# Patient Record
Sex: Male | Born: 1980 | Race: Black or African American | Hispanic: No | Marital: Married | Smoking: Never smoker
Health system: Southern US, Community
[De-identification: ages and names within clinical notes are randomized; demographics above are authoritative.]

## PROBLEM LIST (undated history)

## (undated) DIAGNOSIS — S82892A Other fracture of left lower leg, initial encounter for closed fracture: Secondary | ICD-10-CM

## (undated) HISTORY — PX: TONSILLECTOMY: SUR1361

## (undated) HISTORY — DX: Other fracture of left lower leg, initial encounter for closed fracture: S82.892A

## (undated) HISTORY — PX: TONSILLECTOMY AND ADENOIDECTOMY: SHX28

---

## 2018-02-27 ENCOUNTER — Encounter: Payer: Self-pay | Admitting: Nurse Practitioner

## 2018-02-27 ENCOUNTER — Ambulatory Visit: Payer: Self-pay | Admitting: Nurse Practitioner

## 2018-02-27 VITALS — BP 140/90 | HR 93 | Temp 102.1°F | Wt 286.2 lb

## 2018-02-27 DIAGNOSIS — R509 Fever, unspecified: Secondary | ICD-10-CM

## 2018-02-27 LAB — POC URINALSYSI DIPSTICK (AUTOMATED)
Bilirubin, UA: NEGATIVE
GLUCOSE UA: NEGATIVE
KETONES UA: NEGATIVE
Leukocytes, UA: NEGATIVE
Nitrite, UA: NEGATIVE
Protein, UA: NEGATIVE
RBC UA: NEGATIVE
Urobilinogen, UA: 0.2 E.U./dL
pH, UA: 8 (ref 5.0–8.0)

## 2018-02-27 MED ORDER — LEVOFLOXACIN 500 MG PO TABS: 500.0000 mg | ORAL_TABLET | Freq: Every day | ORAL | 0 refills | Status: AC

## 2018-02-27 NOTE — Progress Notes (Signed)
Subjective:     Justin Serrano is a 37 y.o. male who presents for evaluation of fever. He has had the fever for 3 days. Symptoms have been gradually worsening. Symptoms are described as fevers up to 101.9 degrees, and are worse anytime of the day. Associated symptoms are body aches, chills, diarrhea, fatigue, headache, poor appetite and URI symptoms.  Patient denies nausea, otitis symptoms, urinary tract symptoms and vomiting.  He has tried to alleviate the symptoms with nothing at this time. The patient has no known comorbidities (structural heart/valvular disease, prosthetic joints, immunocompromised state, recent dental work, known abscesses).  The following portions of the patient's history were reviewed and updated as appropriate: allergies, current medications and past medical history.  Review of Systems Constitutional: positive for anorexia, chills, fatigue and fevers, negative for night sweats, sweats and weight loss Eyes: negative Ears, nose, mouth, throat, and face: negative Respiratory: negative Cardiovascular: negative Gastrointestinal: positive for diarrhea, negative for abdominal pain, change in bowel habits, constipation, nausea and vomiting Neurological: positive for headaches, negative for paresthesia, seizures, speech problems, tremors, vertigo and weakness   Objective:    BP 140/90   Pulse 93   Temp (!) 102.1 F (38.9 C)   Wt 286 lb 3.2 oz (129.8 kg)   SpO2 98%  General appearance: alert, cooperative, fatigued and no distress Head: Normocephalic, without obvious abnormality, atraumatic Eyes: conjunctivae/corneas clear. PERRL, EOM's intact. Fundi benign. Ears: normal TM's and external ear canals both ears Nose: Nares normal. Septum midline. Mucosa normal. No drainage or sinus tenderness. Throat: lips, mucosa, and tongue normal; teeth and gums normal Lungs: clear to auscultation bilaterally Heart: regular rate and rhythm, S1, S2 normal, no murmur, click, rub or  gallop Abdomen: soft, non-tender; bowel sounds normal; no masses,  no organomegaly Pulses: 2+ and symmetric Skin: Skin color, texture, turgor normal. No rashes or lesions Lymph nodes: cervical and submandibular nodes normal Neurologic: Grossly normal   Urinalysis was done in the office. Glucose- negative; bilirubin- negative; ketones- negative; specific gravity- 1.005; blood- negative; pH- 8.0, protein- negative; uro-0.2; nitrates- negative; and leukocytes- negative.  Assessment:    Fever is likely secondary to Unknown Origin.   Plan:   Discussed with Dr. Lovell SheehanJenkins the patient's exam findings, diagnosis etiology and medication use and indications reviewed with patient.  Based on that discussion, the patient was instructed to start his Levaquin 500 mg once a day for 7 days.  She was also instructed to start ibuprofen 800 mg every 8 hours until he has been fever free for at least 24 hours.  Patient was instructed to take this medication with food and water on his stomach to protect the stomach lining.  This patient was instructed to follow-up with me tomorrow via phone call.  Patient was instructed to go to the ER immediately if he has increasing fever that will not be relieved with ibuprofen or abdominal pain.  Some concern for an abdominal infection, or bronchial pneumonia that is undetectable on physical exam.  The patient was instructed if he has any abdominal tenderness or worsening symptoms that he should follow-up in the emergency room immediately this was reiterated to the patient several times during our discussion.  Follow- Up and discharge instructions provided. No emergent/urgent issues found on exam.  Patient verbalized understanding of information provided and agrees with plan of care (POC), all questions answered.  1. Fever, unspecified  - POCT Urinalysis Dipstick (Automated) - levofloxacin (LEVAQUIN) 500 MG tablet; Take 1 tablet (500 mg total) by mouth  daily for 7 days.  Dispense: 7  tablet; Refill: 0 -Ibuprofen 800mg  every 8 hours until fever free for 24 hours. -Increase fluids. -Take Levaquin as directed.  -Call our office tomorrow to provide an update for your condition. -Follow up in the ER if worsening fever, abdominal pain, abdominal tenderness or other concerns.

## 2018-02-27 NOTE — Patient Instructions (Signed)
Fever, Adult  -Ibuprofen 800mg  every 8 hours until fever free for 24 hours. -Increase fluids. -Take Levaquin as directed.  -Call our office tomorrow to provide an update for your condition. -Follow up in the ER if worsening fever, abdominal pain, abdominal tenderness or other concerns.  A fever is an increase in the body's temperature. It is usually defined as a temperature of 100F (38C) or higher. Brief mild or moderate fevers generally have no long-term effects, and they often do not require treatment. Moderate or high fevers may make you feel uncomfortable and can sometimes be a sign of a serious illness or disease. The sweating that may occur with repeated or prolonged fever may also cause dehydration. Fever is confirmed by taking a temperature with a thermometer. A measured temperature can vary with:  Age.  Time of day.  Location of the thermometer: ? Mouth (oral). ? Rectum (rectal). ? Ear (tympanic). ? Underarm (axillary). ? Forehead (temporal).  Follow these instructions at home: Pay attention to any changes in your symptoms. Take these actions to help with your condition:  Take over-the counter and prescription medicines only as told by your health care provider. Follow the dosing instructions carefully.  If you were prescribed an antibiotic medicine, take it as told by your health care provider. Do not stop taking the antibiotic even if you start to feel better.  Rest as needed.  Drink enough fluid to keep your urine clear or pale yellow. This helps to prevent dehydration.  Sponge yourself or bathe with room-temperature water to help reduce your body temperature as needed. Do not use ice water.  Do not overbundle yourself in blankets or heavy clothes.  Contact a health care provider if:  You vomit.  You cannot eat or drink without vomiting.  You have diarrhea.  You have pain when you urinate.  Your symptoms do not improve with treatment.  You develop new  symptoms.  You develop excessive weakness. Get help right away if:  You have shortness of breath or have trouble breathing.  You are dizzy or you faint.  You are disoriented or confused.  You develop signs of dehydration, such as a dry mouth, decreased urination, or paleness.  You develop severe pain in your abdomen.  You have persistent vomiting or diarrhea.  You develop a skin rash.  Your symptoms suddenly get worse. This information is not intended to replace advice given to you by your health care provider. Make sure you discuss any questions you have with your health care provider. Document Released: 01/10/2001 Document Revised: 12/23/2015 Document Reviewed: 09/10/2014 Elsevier Interactive Patient Education  [employer]2018 Elsevier Inc.

## 2018-03-01 ENCOUNTER — Telehealth: Payer: Self-pay

## 2018-03-01 NOTE — Telephone Encounter (Signed)
Patient states he is doing great.

## 2018-03-12 ENCOUNTER — Ambulatory Visit: Payer: BC Managed Care – PPO | Admitting: Urgent Care

## 2018-03-12 ENCOUNTER — Encounter: Payer: Self-pay | Admitting: Urgent Care

## 2018-03-12 VITALS — BP 134/84 | HR 90 | Temp 98.3°F | Ht 73.0 in | Wt 286.2 lb

## 2018-03-12 DIAGNOSIS — R590 Localized enlarged lymph nodes: Secondary | ICD-10-CM | POA: Diagnosis not present

## 2018-03-12 NOTE — Progress Notes (Signed)
MRN: [id number] DOB: 06-28-1981  Subjective:   Justin Serrano is a 37 y.o. male presenting for several week history of post-nasal drainage, throat discomfort. Was seen at an [facility], received antibiotics and resolved this issue. He subsequently got a mosquito bite over posterior base of his head. Still has a bump over the area. Denies fever, pain, drainage of pus or bleeding over area, chest pain, abdominal pain, n/v, headaches, dizziness, throat pain, rashes. Denies smoking cigarettes. Has ~4 drinks of alcohol per week. Of note, patient had a CT done for abdominal pain, was having diarrhea that then swung to constipation from using Imodium. CT showed lymphadenopathy per patient and was advised to follow up with a primary care office.   Justin Serrano is not currently taking any medications. Also is allergic to ampicillin.  Justin Serrano  has a past medical history of Closed left ankle fracture. Also  has a past surgical history that includes Tonsillectomy. Maternal grandfather passed from prostate cancer.   Objective:   Vitals: BP 134/84 (BP Location: Right Arm, Patient Position: Sitting, Cuff Size: Large)   Pulse 90   Temp 98.3 F (36.8 C) (Oral)   Ht 6\' 1"  (1.854 m)   Wt 286 lb 3.2 oz (129.8 kg)   SpO2 99%   BMI 37.76 kg/m   Physical Exam  Constitutional: He is oriented to person, place, and time. He appears well-developed and well-nourished.  HENT:  Right Ear: Tympanic membrane normal.  Left Ear: Tympanic membrane normal.  Nose: No mucosal edema, rhinorrhea or sinus tenderness.  Mouth/Throat: Oropharynx is clear and moist.  Eyes: Pupils are equal, round, and reactive to light. EOM are normal. Right eye exhibits no discharge. Left eye exhibits no discharge. No scleral icterus.  Neck: Normal range of motion. Neck supple. No thyromegaly present.  Cardiovascular: Normal rate, regular rhythm, normal heart sounds and intact distal pulses. Exam reveals no gallop and no friction rub.  No murmur  heard. Pulmonary/Chest: Effort normal and breath sounds normal. No stridor. No respiratory distress. He has no wheezes. He has no rales.  Abdominal: Soft. Bowel sounds are normal. He exhibits no distension and no mass. There is no tenderness. There is no rebound and no guarding.  Lymphadenopathy:       Head (right side): Occipital adenopathy present. No submental, no submandibular, no preauricular and no posterior auricular adenopathy present.       Head (left side): No submental, no submandibular, no preauricular, no posterior auricular and no occipital adenopathy present.    He has no cervical adenopathy.       Right cervical: No superficial cervical, no deep cervical and no posterior cervical adenopathy present.      Left cervical: No superficial cervical, no deep cervical and no posterior cervical adenopathy present.    He has no axillary adenopathy.       Right: No inguinal and no supraclavicular adenopathy present.       Left: No inguinal and no supraclavicular adenopathy present.  Neurological: He is alert and oriented to person, place, and time.  Skin: Skin is warm and dry.  Psychiatric: He has a normal mood and affect.   Assessment and Plan :   Lymphadenopathy, occipital - Plan: CBC with Differential/Platelet, HIV antibody, Epstein-Barr virus VCA antibody panel, RPR, Comprehensive metabolic panel  Lymphadenopathy, abdominal - Plan: CBC with Differential/Platelet, HIV antibody, Epstein-Barr virus VCA antibody panel, RPR, Comprehensive metabolic panel, TSH  Labs pending, will have patient start Tylenol and ibuprofen for his lymphadenopathy.  Ultrasound  will be pending for further evaluation and visualization of his occipital lymphadenopathy.  Wallis BambergMario Junah Yam, PA-C [facility] at [facility] [phone] 03/12/2018  9:27 AM

## 2018-03-12 NOTE — Patient Instructions (Addendum)
You may take 500mg  Tylenol with ibuprofen 400-600mg  every 6 hours for pain and inflammation from your lymph nodes.    Lymphadenopathy Lymphadenopathy refers to swollen or enlarged lymph glands, also called lymph nodes. Lymph glands are part of your body's defense (immune) system, which protects the body from infections, germs, and diseases. Lymph glands are found in many locations in your body, including the neck, underarm, and groin. Many things can cause lymph glands to become enlarged. When your immune system responds to germs, such as viruses or bacteria, infection-fighting cells and fluid build up. This causes the glands to grow in size. Usually, this is not something to worry about. The swelling and any soreness often go away without treatment. However, swollen lymph glands can also be caused by a number of diseases. Your health care provider may do various tests to help determine the cause. If the cause of your swollen lymph glands cannot be found, it is important to monitor your condition to make sure the swelling goes away. Follow these instructions at home: Watch your condition for any changes. The following actions may help to lessen any discomfort you are feeling:  Get plenty of rest.  Take medicines only as directed by your health care provider. Your health care provider may recommend over-the-counter medicines for pain.  Apply moist heat compresses to the site of swollen lymph nodes as directed by your health care provider. This can help reduce any pain.  Check your lymph nodes daily for any changes.  Keep all follow-up visits as directed by your health care provider. This is important.  Contact a health care provider if:  Your lymph nodes are still swollen after 2 weeks.  Your swelling increases or spreads to other areas.  Your lymph nodes are hard, seem fixed to the skin, or are growing rapidly.  Your skin over the lymph nodes is red and inflamed.  You have a  fever.  You have chills.  You have fatigue.  You develop a sore throat.  You have abdominal pain.  You have weight loss.  You have night sweats. Get help right away if:  You notice fluid leaking from the area of the enlarged lymph node.  You have severe pain in any area of your body.  You have chest pain.  You have shortness of breath. This information is not intended to replace advice given to you by your health care provider. Make sure you discuss any questions you have with your health care provider. Document Released: 04/25/2008 Document Revised: 12/23/2015 Document Reviewed: 02/19/2014 Elsevier Interactive Patient Education  2018 [employer]      IF you received an x-ray today, you will receive an invoice from [facility]. Please contact [facility] at [phone] with questions or concerns regarding your invoice.   IF you received labwork today, you will receive an invoice from [facility]. Please contact [facility] at [phone] with questions or concerns regarding your invoice.   Our billing staff will not be able to assist you with questions regarding bills from these companies.  You will be contacted with the lab results as soon as they are available. The fastest way to get your results is to activate your My Chart account. Instructions are located on the last page of this paperwork. If you have not heard from us regarding the results in 2 weeks, please contact this office.

## 2018-03-14 ENCOUNTER — Encounter: Payer: Self-pay | Admitting: Urgent Care

## 2018-03-14 LAB — COMPREHENSIVE METABOLIC PANEL
A/G RATIO: 1.6 (ref 1.2–2.2)
ALBUMIN: 4.4 g/dL (ref 3.5–5.5)
ALT: 49 IU/L — ABNORMAL HIGH (ref 0–44)
AST: 43 IU/L — ABNORMAL HIGH (ref 0–40)
Alkaline Phosphatase: 55 IU/L (ref 39–117)
BUN/Creatinine Ratio: 13 (ref 9–20)
BUN: 11 mg/dL (ref 6–20)
Bilirubin Total: 0.4 mg/dL (ref 0.0–1.2)
CALCIUM: 9.6 mg/dL (ref 8.7–10.2)
CO2: 25 mmol/L (ref 20–29)
Chloride: 105 mmol/L (ref 96–106)
Creatinine, Ser: 0.85 mg/dL (ref 0.76–1.27)
GFR calc Af Amer: 130 mL/min/{1.73_m2} (ref >59)
GFR, EST NON AFRICAN AMERICAN: 112 mL/min/{1.73_m2} (ref >59)
GLOBULIN, TOTAL: 2.7 g/dL (ref 1.5–4.5)
Glucose: 94 mg/dL (ref 65–99)
POTASSIUM: 4 mmol/L (ref 3.5–5.2)
Sodium: 145 mmol/L — ABNORMAL HIGH (ref 134–144)
Total Protein: 7.1 g/dL (ref 6.0–8.5)

## 2018-03-14 LAB — EPSTEIN-BARR VIRUS VCA ANTIBODY PANEL
EBV NA IgG: >600 U/mL — ABNORMAL HIGH (ref 0.0–17.9)
EBV VCA IgG: >600 U/mL — ABNORMAL HIGH (ref 0.0–17.9)

## 2018-03-14 LAB — CBC WITH DIFFERENTIAL/PLATELET
Basophils Absolute: 0 10*3/uL (ref 0.0–0.2)
Basos: 1 %
EOS (ABSOLUTE): 0.1 10*3/uL (ref 0.0–0.4)
EOS: 1 %
HEMATOCRIT: 43.4 % (ref 37.5–51.0)
Hemoglobin: 14.3 g/dL (ref 13.0–17.7)
IMMATURE GRANULOCYTES: 0 %
Immature Grans (Abs): 0 10*3/uL (ref 0.0–0.1)
Lymphocytes Absolute: 2.1 10*3/uL (ref 0.7–3.1)
Lymphs: 39 %
MCH: 28.9 pg (ref 26.6–33.0)
MCHC: 32.9 g/dL (ref 31.5–35.7)
MCV: 88 fL (ref 79–97)
MONOS ABS: 0.5 10*3/uL (ref 0.1–0.9)
Monocytes: 9 %
NEUTROS PCT: 50 %
Neutrophils Absolute: 2.7 10*3/uL (ref 1.4–7.0)
PLATELETS: 217 10*3/uL (ref 150–450)
RBC: 4.94 x10E6/uL (ref 4.14–5.80)
RDW: 15.1 % (ref 12.3–15.4)
WBC: 5.3 10*3/uL (ref 3.4–10.8)

## 2018-03-14 LAB — TSH: TSH: 1.17 u[IU]/mL (ref 0.450–4.500)

## 2018-03-14 LAB — RPR: RPR Ser Ql: NONREACTIVE

## 2018-03-14 LAB — HIV ANTIBODY (ROUTINE TESTING W REFLEX): HIV SCREEN 4TH GENERATION: NONREACTIVE

## 2018-03-18 ENCOUNTER — Encounter: Payer: Self-pay | Admitting: Urgent Care

## 2018-03-28 ENCOUNTER — Encounter: Payer: Self-pay | Admitting: Urgent Care

## 2018-04-03 ENCOUNTER — Telehealth: Payer: Self-pay | Admitting: Urgent Care

## 2018-04-03 NOTE — Telephone Encounter (Signed)
Please call patient and schedule follow-up for repeat liver enzymes in 2 months.

## 2018-04-03 NOTE — Telephone Encounter (Signed)
Called pt to try and schedule an appt for a 2 month F/U for liver enzymes per [facility]. Pt said he would have to call back. When he does, please schedule him at his convenience for an OV - 2 month F/U - liver enzymes with [facility].  Thank you!

## 2018-04-03 NOTE — Telephone Encounter (Signed)
Patient's concerns addressed on another my chart encounter.

## 2018-04-05 ENCOUNTER — Ambulatory Visit (HOSPITAL_COMMUNITY)
Admission: RE | Admit: 2018-04-05 | Discharge: 2018-04-05 | Disposition: A | Discharge status: Home / Self Care | Payer: BC Managed Care – PPO | Source: Ambulatory Visit | Attending: Urgent Care | Admitting: Urgent Care

## 2018-04-05 DIAGNOSIS — R590 Localized enlarged lymph nodes: Secondary | ICD-10-CM | POA: Insufficient documentation

## 2018-10-25 ENCOUNTER — Other Ambulatory Visit: Payer: Self-pay

## 2018-10-25 ENCOUNTER — Ambulatory Visit
Admission: EM | Admit: 2018-10-25 | Discharge: 2018-10-25 | Disposition: A | Discharge status: Home / Self Care | Payer: BC Managed Care – PPO | Attending: Physician Assistant | Admitting: Physician Assistant

## 2018-10-25 ENCOUNTER — Encounter: Payer: Self-pay | Admitting: Emergency Medicine

## 2018-10-25 DIAGNOSIS — R0989 Other specified symptoms and signs involving the circulatory and respiratory systems: Secondary | ICD-10-CM

## 2018-10-25 DIAGNOSIS — R198 Other specified symptoms and signs involving the digestive system and abdomen: Secondary | ICD-10-CM

## 2018-10-25 MED ORDER — FLUTICASONE PROPIONATE 50 MCG/ACT NA SUSP: 2.0000 | Freq: Every day | NASAL | 0 refills | Status: DC

## 2018-10-25 MED ORDER — CETIRIZINE HCL 10 MG PO TABS: 10.0000 mg | ORAL_TABLET | Freq: Every day | ORAL | 0 refills | Status: DC

## 2018-10-25 MED ORDER — FAMOTIDINE 20 MG PO TABS: 20.0000 mg | ORAL_TABLET | Freq: Every day | ORAL | 0 refills | Status: DC

## 2018-10-25 MED ORDER — IPRATROPIUM BROMIDE 0.06 % NA SOLN: 2.0000 | Freq: Four times a day (QID) | NASAL | 0 refills | Status: DC

## 2018-10-25 NOTE — Discharge Instructions (Signed)
As discussed, no alarming signs on exam. This can be due to post nasal drip or acid reflux. Start zyrtec, flonase, atrovent nasal spray for nasal congestion/drainage. You can use over the counter nasal saline rinse such as neti pot for nasal congestion. Keep hydrated, your urine should be clear to pale yellow in color. Can start pepcid for acid reflux. Follow up with PCP if symptoms not improving. If experiencing increased chest pain, shortness of breath, weakness, dizziness, palpitations, passing out, go to the emergency department for further evaluation needed.

## 2018-10-25 NOTE — ED Notes (Signed)
Patient able to ambulate independently  

## 2018-10-25 NOTE — ED Triage Notes (Signed)
Pt presents to [facility] for assessment of 1 month of "always feeling like there's phlegm in my throat".  Pt states last night he began having chest heaviness and shortness of breath at the end of climbing the stairs.  Denies cough, denies n/v/d, denies fever or chills.

## 2018-10-25 NOTE — ED Provider Notes (Signed)
[name]    CSN: [id number] Arrival date & time: 10/25/18  1242     History   Chief Complaint Chief Complaint  Patient presents with  . Shortness of Breath    HPI Justin Serrano is a 38 y.o. male.   38 year old male comes in for 1 day history of shortness of breath and chest heaviness.  States for the past month, he has had globus sensation daily. Yesterday, noticed some shortness of breath and chest heaviness when climbing stairs and therefore came in for evaluation. He states the symptoms resolved after a few mins. He denies associated nausea, vomiting, weakness, dizziness, diaphoresis. He states now wonders if it is due to nasal congestion as he had no symptoms when he road his bike for 40 mins and was breathing through his mouth. He has mild cough, mainly to clear his throat. He has not noticed much rhinorrhea. Denies fever, chills, night sweats. Denies abdominal pain, nausea, vomiting. Has not had a repeat symptom of chest heaviness, shortness of breath. He denies personal history of HTN, DM, heart disease. Denies family history of heart disease. Denies sick contact. Has not traveled. Has not tried medications. States increased fluid intake sometimes help with symptoms.      Past Medical History:  Diagnosis Date  . Closed left ankle fracture     There are no active problems to display for this patient.   Past Surgical History:  Procedure Laterality Date  . TONSILLECTOMY         Home Medications    Prior to Admission medications   Medication Sig Start Date End Date Taking? Authorizing Provider  cetirizine (ZYRTEC) 10 MG tablet Take 1 tablet (10 mg total) by mouth daily. 10/25/18   Cathie Hoops, Floree Zuniga V, PA-C  famotidine (PEPCID) 20 MG tablet Take 1 tablet (20 mg total) by mouth daily. 10/25/18   Cathie Hoops, Konstance Happel V, PA-C  fluticasone (FLONASE) 50 MCG/ACT nasal spray Place 2 sprays into both nostrils daily. 10/25/18   Cathie Hoops, Ruthann Angulo V, PA-C  ipratropium (ATROVENT) 0.06 % nasal spray  Place 2 sprays into both nostrils 4 (four) times daily. 10/25/18   Belinda Fisher, PA-C    Family History Family History  Problem Relation Age of Onset  . Cancer Maternal Grandfather     Social History Social History   Tobacco Use  . Smoking status: Never Smoker  . Smokeless tobacco: Never Used  Substance Use Topics  . Alcohol use: Yes    Comment: occasionally  . Drug use: Never     Allergies   Ampicillin   Review of Systems Review of Systems  Reason unable to perform ROS: See HPI as above.     Physical Exam Triage Vital Signs ED Triage Vitals [10/25/18 1249]  Enc Vitals Group     BP (!) 141/93     Pulse Rate 77     Resp 18     Temp 97.9 F (36.6 C)     Temp Source Oral     SpO2 98 %     Weight      Height      Head Circumference      Peak Flow      Pain Score 7     Pain Loc      Pain Edu?      Excl. in GC?    No data found.  Updated Vital Signs BP (!) 141/93 (BP Location: Left Arm)   Pulse 77   Temp 97.9 F (  36.6 C) (Oral)   Resp 18   SpO2 98%   Physical Exam Constitutional:      General: He is not in acute distress.    Appearance: He is well-developed. He is not ill-appearing, toxic-appearing or diaphoretic.  HENT:     Head: Normocephalic and atraumatic.     Right Ear: Tympanic membrane, ear canal and external ear normal. Tympanic membrane is not erythematous or bulging.     Left Ear: Tympanic membrane, ear canal and external ear normal. Tympanic membrane is not erythematous or bulging.     Nose: Nose normal.     Right Sinus: No maxillary sinus tenderness or frontal sinus tenderness.     Left Sinus: No maxillary sinus tenderness or frontal sinus tenderness.     Mouth/Throat:     Mouth: Mucous membranes are moist.     Pharynx: Oropharynx is clear. Uvula midline.  Eyes:     Conjunctiva/sclera: Conjunctivae normal.     Pupils: Pupils are equal, round, and reactive to light.  Neck:     Musculoskeletal: Normal range of motion and neck supple.   Cardiovascular:     Rate and Rhythm: Normal rate and regular rhythm.     Heart sounds: Normal heart sounds. No murmur. No friction rub. No gallop.   Pulmonary:     Effort: Pulmonary effort is normal. No accessory muscle usage, prolonged expiration, respiratory distress or retractions.     Breath sounds: Normal breath sounds. No stridor, decreased air movement or transmitted upper airway sounds. No decreased breath sounds, wheezing, rhonchi or rales.     Comments: Speaking in full sentences without difficulty.  Chest:     Chest wall: No tenderness.  Skin:    General: Skin is warm and dry.  Neurological:     Mental Status: He is alert and oriented to person, place, and time.    UC Treatments / Results  Labs (all labs ordered are listed, but only abnormal results are displayed) Labs Reviewed - No data to display  EKG None  Radiology No results found.  Procedures Procedures (including critical care time)  Medications Ordered in UC Medications - No data to display  Initial Impression / Assessment and Plan / UC Course  I have reviewed the triage vital signs and the nursing notes.  Pertinent labs & imaging results that were available during my care of the patient were reviewed by me and considered in my medical decision making (see chart for details).    No alarming signs on exam. Patient has not had recurrent chest pressure, shortness of breath. He has not had any exertional fatigue. Discussed possible post nasal drip or acid reflux causing symptoms. Symptomatic treatment discussed. Return precautions given. Patient expresses understanding and agrees to plan.  Final Clinical Impressions(s) / UC Diagnoses   Final diagnoses:  Globus sensation    ED Prescriptions    Medication Sig Dispense Auth. Provider   ipratropium (ATROVENT) 0.06 % nasal spray Place 2 sprays into both nostrils 4 (four) times daily. 15 mL Kemi Gell V, PA-C   fluticasone (FLONASE) 50 MCG/ACT nasal spray Place  2 sprays into both nostrils daily. 1 g Shakura Cowing V, PA-C   cetirizine (ZYRTEC) 10 MG tablet Take 1 tablet (10 mg total) by mouth daily. 30 tablet Ragna Kramlich V, PA-C   famotidine (PEPCID) 20 MG tablet Take 1 tablet (20 mg total) by mouth daily. 15 tablet Belinda Fisher, PA-C        Wilian Kwong V, PA-C  10/25/18 1437  

## 2018-11-15 ENCOUNTER — Telehealth: Payer: BC Managed Care – PPO | Admitting: Nurse Practitioner

## 2018-11-15 DIAGNOSIS — R6889 Other general symptoms and signs: Principal | ICD-10-CM

## 2018-11-15 DIAGNOSIS — Z20822 Contact with and (suspected) exposure to covid-19: Secondary | ICD-10-CM

## 2018-11-15 MED ORDER — BENZONATATE 100 MG PO CAPS: 100.0000 mg | ORAL_CAPSULE | Freq: Three times a day (TID) | ORAL | 0 refills | Status: DC | PRN

## 2018-11-15 NOTE — Progress Notes (Signed)
E-Visit for Corona Virus Screening  Based on your current symptoms, you may very well have the virus, however your symptoms are mild. Currently, not all patients are being tested. If the symptoms are mild and there is not a known exposure, performing the test is not indicated.  * you are doing the right thing by quarantining yourself. babie are not getting covid but to be safe continue what you are doing.  Coronavirus disease 2019 (COVID-19) is a respiratory illness that can spread from person to person. The virus that causes COVID-19 is a new virus that was first identified in the country of [place] but is now found in multiple other countries and has spread to the [place].  Symptoms associated with the virus are mild to severe fever, cough, and shortness of breath. There is currently no vaccine to protect against COVID-19, and there is no specific antiviral treatment for the virus.   To be considered HIGH RISK for Coronavirus (COVID-19), you have to meet the following criteria:  . Traveled to [place], [place], [place], [place] or [place]; or in the [place] to [place], [place], [place], or [place]; and have fever, cough, and shortness of breath within the last 2 weeks of travel OR  . Been in close contact with a person diagnosed with COVID-19 within the last 2 weeks and have fever, cough, and shortness of breath  . IF YOU DO NOT MEET THESE CRITERIA, YOU ARE CONSIDERED LOW RISK FOR COVID-19.   It is vitally important that if you feel that you have an infection such as this virus or any other virus that you stay home and away from places where you may spread it to others.  You should self-quarantine for 14 days if you have symptoms that could potentially be coronavirus and avoid contact with people age 37 and older.   You can use medication such as A prescription cough medication called Tessalon Perles 100 mg. You may take 1-2 capsules every 8 hours as needed for cough  You may also  take acetaminophen (Tylenol) as needed for fever.   Reduce your risk of any infection by using the same precautions used for avoiding the common cold or flu:  Marland Kitchen Wash your hands often with soap and warm water for at least 20 seconds.  If soap and water are not readily available, use an alcohol-based hand sanitizer with at least 60% alcohol.  . If coughing or sneezing, cover your mouth and nose by coughing or sneezing into the elbow areas of your shirt or coat, into a tissue or into your sleeve (not your hands). . Avoid shaking hands with others and consider head nods or verbal greetings only. . Avoid touching your eyes, nose, or mouth with unwashed hands.  . Avoid close contact with people who are sick. . Avoid places or events with large numbers of people in one location, like concerts or sporting events. . Carefully consider travel plans you have or are making. . If you are planning any travel outside or inside the [place], visit the CDC's Travelers' Health webpage for the latest health notices. . If you have some symptoms but not all symptoms, continue to monitor at home and seek medical attention if your symptoms worsen. . If you are having a medical emergency, call 911.  HOME CARE . Only take medications as instructed by your medical team. . Drink plenty of fluids and get plenty of rest. . A steam or ultrasonic humidifier can help  if you have congestion.   GET HELP RIGHT AWAY IF: . You develop worsening fever. . You become short of breath . You cough up blood. . Your symptoms become more severe MAKE SURE YOU   Understand these instructions.  Will watch your condition.  Will get help right away if you are not doing well or get worse.  Your e-visit answers were reviewed by a board certified advanced clinical practitioner to complete your personal care plan.  Depending on the condition, your plan could have included both over the counter or prescription medications.  If there is a  problem please reply once you have received a response from your provider. Your safety is important to us.  If you have drug allergies check your prescription carefully.    You can use MyChart to ask questions about today's visit, request a non-urgent call back, or ask for a work or school excuse for 24 hours related to this e-Visit. If it has been greater than 24 hours you will need to follow up with your provider, or enter a new e-Visit to address those concerns. You will get an e-mail in the next two days asking about your experience.  I hope that your e-visit has been valuable and will speed your recovery. Thank you for using e-visits.  5 minutes spent reviewing and documenting in chart.

## 2018-11-18 ENCOUNTER — Telehealth: Payer: BC Managed Care – PPO | Admitting: Internal Medicine

## 2018-11-18 DIAGNOSIS — R509 Fever, unspecified: Secondary | ICD-10-CM

## 2018-11-18 NOTE — Progress Notes (Signed)
Pt requested to be called back.  I called pt back and he gave me the following history.  4/16 woke up from a nap and felt a "little off", had dinner and went to bed, then next morning woke up fatigued but more than usual  and stayed this way the rest of the day and took a couple of naps, then around 5:30 pm felt better and had low grade temp( he runs temp 97.6) and his temp was up to 99.7 in  R ear, but forehead temp read 98.8. Had e-visit Friday and was told to watch his symptoms, since it was hard to tell if he had possible corona or not. So he has been staying home. Fatigue has completely resolved.  Sat had slight HA with low grade temp up to 100 with ear thermometer, but forehead temp was normal, appetite was nl, had mild sweats from fever braking that day. Still no URI or GI symptoms, taste change, cough or SOB.  Sunday woke up again with a HA and low grade temp. Took tylenol last night and HA resolved. Has been eating and drinking fluids well. Gets in at least 60-80 oz a day.  This am got up at 9 am, with no HA. Temp at 9:30 am was 99.9 in his ear, and he took it right now as we spoke in  L ear 98.9,  And R ear was 99.0 . He feels normal right now, but needs to know what precautions to take for his family.  I told him, that I am concerned the low grade temps are not accurate since there is a discrepancy and should get a digital oral thermometer and retake his temp to see what is running. In the mean time, continue quarantine for 14 days from the onset of symptoms as told by the prior provider.  I reviewed symptoms of infectious causes that need attention, and he does not have them right now. He understands and will continue to monitor his temp and symptoms.

## 2018-11-24 ENCOUNTER — Telehealth: Payer: BC Managed Care – PPO | Admitting: Physician Assistant

## 2018-11-24 ENCOUNTER — Telehealth (INDEPENDENT_AMBULATORY_CARE_PROVIDER_SITE_OTHER): Payer: Self-pay

## 2018-11-24 DIAGNOSIS — R509 Fever, unspecified: Secondary | ICD-10-CM

## 2018-11-24 NOTE — Progress Notes (Signed)
E-Visit for Corona Virus Screening  Based on your current symptoms, you may very well have the virus, however your symptoms are mild. Currently, not all patients are being tested. If the symptoms are mild and there is not a known exposure, performing the test is not indicated.  Coronavirus disease 2019 (COVID-19)is a respiratory illness that can spread from person to person. The virus that causes COVID-19 is a new virus that was first identified in the country of [place] but is now found in multiple other countries and has spread to the [place].  Symptoms associated with the virus are mild to severe fever, cough, and shortness of breath. There is currently no vaccine to protect against COVID-19, and there is no specific antiviral treatment for the virus.   To be considered HIGH RISK for Coronavirus (COVID-19), you have to meet the following criteria:  . Traveled to [place], [place], [place], [place] or [place]; or in the [place] to [place], [place], [place], or [place]; and have fever, cough, and shortness of breath within the last 2 weeks of travel OR  . Been in close contact with a person diagnosed with COVID-19 within the last 2 weeks and have fever, cough, and shortness of breath  . IF YOU DO NOT MEET THESE CRITERIA, YOU ARE CONSIDERED LOW RISK FOR COVID-19.   It is vitally important that if you feel that you have an infection such as this virus or any other virus that you stay home and away from places where you may spread it to others.  You should self-quarantine for 14 days if you have symptoms that could potentially be coronavirus and avoid contact with people age 37 and older.    You may also take acetaminophen (Tylenol) as needed for fever.  I would recommend taking 1000 mg every 8 hours or so to manage fever.  NSAIDS, such as Aleve and Ibuprofen, are currently not recommended if COVID-19 is suspected.    Reduce your risk of any infection by using the same precautions used  for avoiding the common cold or flu: Wash your hands often with soap and warm water for at least 20 seconds.  If soap and water are not readily available, use an alcohol-based hand sanitizer with at least 60% alcohol.  If coughing or sneezing, cover your mouth and nose by coughing or sneezing into the elbow areas of your shirt or coat, into a tissue or into your sleeve (not your hands). Avoid shaking hands with others and consider head nods or verbal greetings only.  Avoid touching your eyes,nose, or mouth with unwashed hands. Avoid close contact with people who are sick. Avoid places or events with large numbers of people in one location, like concerts or sporting events. Carefully consider travel plans you have or are making. If you are planning any travel outside or inside the [place], visit the CDC'sTravelers' Health webpagefor the latest health notices. If you have some symptoms but not all symptoms, continue to monitor at home and seek medical attention if your symptoms worsen. If you are having a medical emergency, call 911.  HOME CARE Only take medications as instructed by your medical team. Drink plenty of fluids and get plenty of rest. A steam or ultrasonic humidifier can help if you have congestion.   GET HELP RIGHT AWAY IF: You develop worsening fever. You become short of breath You cough up blood. Your symptoms become more severe MAKE SURE YOU  Understand these instructions. Will watch your  condition. Will get help right away if you are not doing well or get worse.  Your e-visit answers were reviewed by a board certified advanced clinical practitioner to complete your personal care plan.  Depending on the condition, your plan could have included both over the counter or prescription medications.  If there is a problem please reply once you have received a response from your provider. Your safety is important to [place].  If you have drug allergies check your prescription  carefully.    You can use MyChart to ask questions about today's visit, request a non-urgent call back, or ask for a work or school excuse for 24 hours related to this e-Visit. If it has been greater than 24 hours you will need to follow up with your provider, or enter a new e-Visit to address those concerns. You will get an e-mail in the next two days asking about your experience.  I hope that your e-visit has been valuable and will speed your recovery. Thank you for using e-visits.    ===View-only below this line===   ----- Message -----    From: Justin Serrano    Sent: 11/24/2018 12:20 PM EDT      To: E-Visit Mailing List Subject: E-Visit Submission: CoronaVirus (COVID-19) Screening  E-Visit Submission: CoronaVirus (COVID-19) Screening --------------------------------  Question: Do you have any of the following?  Answer:   Fever  Question: Do you have any of the following additional symptoms?  Answer:   None of these  Question: Have you had a fever? Answer:   Yes  Question: If you are running a fever, please type in your temperature reading Answer:   no fever currently  Question: How long have you had the fever? Answer:   For a few days  Question: Have others in your home or workplace had similar symptoms? Answer:   No  Question: When did your symptoms start? Answer:   4/17  Question: Have you recently visited any of the following countries? Answer:   None of these  Question: If you have traveled anywhere in the last  2 months please document where you have visited: Answer:   N/A  Question: Have you recently been around others from these countries or visited these countries who have had coughing or fever? Answer:   No  Question: Have you recently been around anyone who has been diagnosed with Corona virus? Answer:   No  Question: Have you been taking any medications? Answer:   Yes  Question: If taking medications for these symptoms, please list the names and  whether they are helping or not Answer:   Tylenol - mostly helping  Question: Are you treated for any of the following conditions: Asthma, COPD, Diabetes, Renal Failure (on Dialysis), AIDS, any Neuromuscular disease that effects the clearing of secretions, Heart Failure, or Heart Disease? Answer:   No  Question: Please enter a phone number where you can be reached if we have additional questions about your symptoms Answer:   [phone]  Question: Please list your medication allergies that you may have ? (If 'none' , please list as 'none') Answer:   N/A  Question: Please list any additional comments  Answer:   Requesting a phone call to discuss how to best manage fever.  A total of 5-10 minutes was spent evaluating this patients questionnaire and formulating a plan of care.

## 2018-11-25 ENCOUNTER — Encounter (INDEPENDENT_AMBULATORY_CARE_PROVIDER_SITE_OTHER): Payer: Self-pay

## 2018-11-25 NOTE — Progress Notes (Signed)
Mr. Kromah,  On the requested phone call I have advised the following: -Continue quarantine for 4 more days to complete 14 days of quarantine or until symptoms resolve ( you stated onset of symptoms was 10 days ago) -Take Tylenol only as needed for fever, headache, body ache, and it is not scheduled.  -If there is temperature discrepancy between oral/auditory temp, and have advised that if there is a big gap, go with the higher reading.  -Pulse rate is normal between 60-100. Greater than 100 is considered high. Some factors that may increase the hear rate include: fear, worry, physical exertion, or fever. You stated your HR is in the 80-90s. If persistently above 100, and if you develop symptoms of palpitations, dizziness, weakness, go to the ER -I have advised that if you have any symptoms or new complaints, you may submit another Evisit, or any worsening go to the ER.

## 2018-12-02 ENCOUNTER — Encounter: Payer: Self-pay | Admitting: Physician Assistant

## 2018-12-02 ENCOUNTER — Telehealth: Payer: BC Managed Care – PPO | Admitting: Physician Assistant

## 2018-12-02 DIAGNOSIS — R059 Cough, unspecified: Secondary | ICD-10-CM

## 2018-12-02 DIAGNOSIS — R05 Cough: Secondary | ICD-10-CM

## 2018-12-02 NOTE — Progress Notes (Signed)
Based on what you shared with me, I feel your condition warrants further evaluation and I recommend that you be seen for a face to face office visit.  Glad to hear that you are better Justin Serrano! I spoke to your last time you requested a call regarding your symptoms and vitals. Normal heart rate is 60-100 bpm. Since you have not had any above 100, and since you dont have complaints of palpitations, dizziness, chest pain, shortness of breath, then I dont believe there is anything to worry about. If you develop any symptoms, or have changes to your vitals, reach out to your primary care provider, submit an Evisit, or go to an urgent care.      NOTE: If you entered your credit card information for this eVisit, you will not be charged. You may see a "hold" on your card for the $35 but that hold will drop off and you will not have a charge processed.  If you are having a true medical emergency please call 911.  If you need an urgent face to face visit, [place] has four urgent care centers for your convenience.    PLEASE NOTE: THE [facility] LOCATIONS AND URGENT CARE CLINICS DO NOT HAVE THE TESTING FOR CORONAVIRUS COVID19 AVAILABLE.  IF YOU FEEL YOU NEED THIS TEST YOU MUST GO TO A TRIAGE LOCATION AT ONE OF THE HOSPITAL EMERGENCY DEPARTMENTS   [email or web address] to reserve your spot online an avoid wait times  [facility] [address] [place], [place] [address] Modified hours of operation: Monday-Friday, 12 PM to 6 PM  Saturday & Sunday 10 AM to 4 PM *Across the street from [employer]  [employer] [employer] (New Address!) [address], [address] [place], [place] [address] *Just off [employer], across the road from [place]* Modified hours of operation: Monday-Friday, 12 PM to 6 PM  Closed Saturday & Sunday  [facility]'s modified hours of operation will be in effect from May 1 until May 31   The following sites will take your insurance:  .  [facility]  [phone] Get Driving Directions Find a Provider at this Location  [address] [place], [place] [address] . 10 am to 8 pm Monday-Friday . 12 pm to 8 pm Saturday-Sunday   . [facility]  [phone] Get Driving Directions Find a Provider at this Location  [address], [address] [place], [place] [address] . 8 am to 8 pm Monday-Friday . 9 am to 6 pm Saturday . 11 am to 6 pm Sunday   . [facility] at [facility]  [phone] Get Driving Directions  [address] [place], [place] [address] . 8 am to 8 pm Monday-Friday . 8 am to 4 pm Saturday-Sunday   Your e-visit answers were reviewed by a board certified advanced clinical practitioner to complete your personal care plan.  Thank you for using e-Visits. I have spent 7 min in completion and review of this note- [facility]

## 2018-12-03 ENCOUNTER — Emergency Department (HOSPITAL_COMMUNITY): Payer: BC Managed Care – PPO

## 2018-12-03 ENCOUNTER — Other Ambulatory Visit: Payer: Self-pay

## 2018-12-03 ENCOUNTER — Emergency Department (HOSPITAL_COMMUNITY)
Admission: EM | Admit: 2018-12-03 | Discharge: 2018-12-03 | Disposition: A | Discharge status: Home / Self Care | Payer: BC Managed Care – PPO | Attending: Emergency Medicine | Admitting: Emergency Medicine

## 2018-12-03 DIAGNOSIS — R000 Tachycardia, unspecified: Secondary | ICD-10-CM | POA: Diagnosis present

## 2018-12-03 DIAGNOSIS — R509 Fever, unspecified: Secondary | ICD-10-CM | POA: Diagnosis not present

## 2018-12-03 DIAGNOSIS — Z79899 Other long term (current) drug therapy: Secondary | ICD-10-CM | POA: Insufficient documentation

## 2018-12-03 DIAGNOSIS — R5383 Other fatigue: Secondary | ICD-10-CM | POA: Insufficient documentation

## 2018-12-03 DIAGNOSIS — R202 Paresthesia of skin: Secondary | ICD-10-CM | POA: Diagnosis not present

## 2018-12-03 LAB — CBC WITH DIFFERENTIAL/PLATELET
Abs Immature Granulocytes: 0 10*3/uL (ref 0.00–0.07)
Basophils Absolute: 0 10*3/uL (ref 0.0–0.1)
Basophils Relative: 0 %
Eosinophils Absolute: 0.1 10*3/uL (ref 0.0–0.5)
Eosinophils Relative: 1 %
HCT: 41.1 % (ref 39.0–52.0)
Hemoglobin: 13.6 g/dL (ref 13.0–17.0)
Lymphocytes Relative: 46 %
Lymphs Abs: 3.8 10*3/uL (ref 0.7–4.0)
MCH: 29.5 pg (ref 26.0–34.0)
MCHC: 33.1 g/dL (ref 30.0–36.0)
MCV: 89.2 fL (ref 80.0–100.0)
Monocytes Absolute: 0.4 10*3/uL (ref 0.1–1.0)
Monocytes Relative: 5 %
Neutro Abs: 3.9 10*3/uL (ref 1.7–7.7)
Neutrophils Relative %: 48 %
Platelets: 213 10*3/uL (ref 150–400)
RBC: 4.61 MIL/uL (ref 4.22–5.81)
RDW: 13.4 % (ref 11.5–15.5)
WBC: 8.2 10*3/uL (ref 4.0–10.5)
nRBC: 0 /100 WBC
nRBC: 0.2 % (ref 0.0–0.2)

## 2018-12-03 LAB — BASIC METABOLIC PANEL
Anion gap: 13 (ref 5–15)
BUN: 7 mg/dL (ref 6–20)
CO2: 20 mmol/L — ABNORMAL LOW (ref 22–32)
Calcium: 9 mg/dL (ref 8.9–10.3)
Chloride: 106 mmol/L (ref 98–111)
Creatinine, Ser: 0.8 mg/dL (ref 0.61–1.24)
GFR calc Af Amer: >60 mL/min (ref >60)
GFR calc non Af Amer: >60 mL/min (ref >60)
Glucose, Bld: 98 mg/dL (ref 70–99)
Potassium: 4.2 mmol/L (ref 3.5–5.1)
Sodium: 139 mmol/L (ref 135–145)

## 2018-12-03 LAB — D-DIMER, QUANTITATIVE (NOT AT ARMC): D-Dimer, Quant: 3.16 ug/mL-FEU — ABNORMAL HIGH (ref 0.00–0.50)

## 2018-12-03 MED ORDER — IOHEXOL 350 MG/ML SOLN
100.0000 mL | Freq: Once | INTRAVENOUS | Status: AC | PRN
  Administered 2018-12-03: 100 mL via INTRAVENOUS

## 2018-12-03 MED ORDER — ACETAMINOPHEN 325 MG PO TABS
650.0000 mg | ORAL_TABLET | Freq: Once | ORAL | Status: AC
  Administered 2018-12-03: 13:00:00 650 mg via ORAL
  Filled 2018-12-03: qty 2

## 2018-12-03 MED ORDER — SODIUM CHLORIDE 0.9 % IV BOLUS
1000.0000 mL | Freq: Once | INTRAVENOUS | Status: AC
  Administered 2018-12-03: 13:00:00 1000 mL via INTRAVENOUS

## 2018-12-03 NOTE — ED Notes (Signed)
Pt ambulated with this RN. Pt HR 110 with ambulation, SpO2 >97% during ambulation.

## 2018-12-03 NOTE — ED Triage Notes (Signed)
Pt here after finishing self-quarantine for potential covid on May 1st. Has no symptoms now. But since then he sts his resting heart rate as been higher and he has tingling in bilateral feet.

## 2018-12-03 NOTE — Discharge Instructions (Addendum)
You were seen in the ED for elevated heart rate.   Labs, chest x-ray, CT were normal.    The cause of your elevated heart rate is unclear but most likely from your body recovering from recent illness or mild dehydration.  Drink 1-2 L of water. Avoid high sugar drinks. Sometimes tobacco, caffeine or poor sleep can cause elevated heart rate.   Follow up with your primary care doctor in 1 week if symptoms persist and heart rate remains greater than 100  Return for chest pain or shortness of breath with exertion, palpitations, light-headedness, lower leg swelling or pain, cough, fever, persistent heart rate greater than 115-120s

## 2018-12-03 NOTE — ED Notes (Signed)
Pulse ambulating to other room was 110. Resting it is now 89/90. No signs of distress.

## 2018-12-03 NOTE — ED Provider Notes (Signed)
1808: Pt under initial care of ED PA Couture now in progressive bed pending labs, reassessment. See previous note for details. Briefly patient here for exertional increase in heart rate up to 110s, after getting over COVID type symptoms.  Was not formally tested for COVID but COVID symptoms resolved 1 week ago. Hi baseline HR 68-70, but FITBIT showing HR in high 80-90s for last week.   He denies pleuritic or exertional CP, SOB, leg swelling, swelling or calf pain, hemoptysis, hormonal use, recent surgery or prolonged immobilization. No h/o DVT/PE.  No other risk factors for PE or DVT.   Physical Exam  BP 120/78   Pulse 85   Temp 98.5 F (36.9 C) (Oral)   Resp (!) 25   Ht 6\' 1"  (1.854 m)   Wt 119.3 kg   SpO2 99%   BMI 34.70 kg/m   Physical Exam Vitals signs and nursing note reviewed.  Constitutional:      General: He is not in acute distress.    Appearance: He is well-developed.     Comments: NAD.  HENT:     Head: Normocephalic and atraumatic.     Right Ear: External ear normal.     Left Ear: External ear normal.     Nose: Nose normal.  Eyes:     General: No scleral icterus.    Conjunctiva/sclera: Conjunctivae normal.  Neck:     Musculoskeletal: Normal range of motion and neck supple.  Cardiovascular:     Rate and Rhythm: Regular rhythm. Tachycardia present.     Heart sounds: Normal heart sounds. No murmur.     Comments: 1+ radial and DP pulses bilaterally. No LE edema or calf tenderness.  Pulmonary:     Effort: Pulmonary effort is normal.     Breath sounds: Normal breath sounds. No wheezing.  Musculoskeletal: Normal range of motion.        General: No deformity.  Skin:    General: Skin is warm and dry.     Capillary Refill: Capillary refill takes less than 2 seconds.  Neurological:     Mental Status: He is alert and oriented to person, place, and time.  Psychiatric:        Behavior: Behavior normal.        Thought Content: Thought content normal.        Judgment:  Judgment normal.     ED Course/Procedures   Clinical Course as of Dec 02 1809  Tue Dec 03, 2018  1619 D-dimer 3.16    [CG]  1621 D-dimer 3.16    [CG]    Clinical Course User Index [CG] Liberty Handy, PA-C    Procedures  MDM   1430: D-dimer was added given overall low clinical suspicion for DVT/PE unable to [facility] out given intial tachycardia.  COVID has been shown to cause microemboli in very rare cases.   Ambulated to the pod with mild tachycardia but no CP, SOB, hypoxemia. Discussed POC with patient who is comfortable with this.  1815:  d-dimer was elevated and f/u CTAP negative for PE or abnormalities in lungs.  Discussed results with patient. Transient tachycardia may have been from mild dehydration from recent illness.  Encouraged oral hydration. Return precautions given.      Liberty Handy, PA-C 12/03/18 1815    Gwyneth Sprout, MD 12/03/18 [place]

## 2018-12-03 NOTE — ED Provider Notes (Addendum)
[facility] EMERGENCY DEPARTMENT Provider Note   CSN: [id number] Arrival date & time: 12/03/18  1142    History   Chief Complaint No chief complaint on file.   HPI Justin Serrano is a 38 y.o. male.     HPI  Pt is a 38 y/o male with a h/o closed left ankle fracture who presents to the ED for eval of mult complaints including elevated HR and feet tingling.  Pt notes that he began to have fatigue, chills, fever on 4/16. He contacted his pcp who advised him to self quarantine for 2 weeks. He was not tested as sxs were minimal. He states he finished his self quarantine May 1 and states he feels mostly back to normal and has not noted a fever since last week.   However he did become concerned because for the last few days he has noticed an elevated HR that is intermittent. States that his resting HR is normal, however whenever he tries to do his daily activities his HR increases to the 90s and low 100s.  He exercised last night without issue and his HR was around 120. HR goes back to normal quickly upon resting. No CP, SOB or palpitations. Has fit bit and has been checking HR. Denies leg pain/swelling, hemoptysis, recent surgery/trauma, recent long travel, hormone use, personal hx of cancer, or hx of DVT/PE.   He also c/o tingling to the bilat feet for the last few days. States that sxs are intermittent and fleeting. Sxs go away when he has on shoes however if he is barefoot or just has socks on the arches feet become tingly. Sxs are not currently present. No back pain.  Pt denies any numbness/tingling/weakness to the BLE. Denies saddle anesthesia. Denies loss of control of bowels or bladder. No urinary retention. No fevers. Denies a h/o IVDU. Denies a h/o CA or recent unintended weight loss.  States he has felt increased anxiety while he was being quarantined.   Past Medical History:  Diagnosis Date  . Closed left ankle fracture     There are no active problems to  display for this patient.   Past Surgical History:  Procedure Laterality Date  . TONSILLECTOMY          Home Medications    Prior to Admission medications   Medication Sig Start Date End Date Taking? Authorizing Provider  benzonatate (TESSALON PERLES) 100 MG capsule Take 1 capsule (100 mg total) by mouth 3 (three) times daily as needed. 11/15/18   Daphine Deutscher Mary-Margaret, FNP  cetirizine (ZYRTEC) 10 MG tablet Take 1 tablet (10 mg total) by mouth daily. 10/25/18   Cathie Hoops, Amy V, PA-C  famotidine (PEPCID) 20 MG tablet Take 1 tablet (20 mg total) by mouth daily. 10/25/18   Cathie Hoops, Amy V, PA-C  fluticasone (FLONASE) 50 MCG/ACT nasal spray Place 2 sprays into both nostrils daily. 10/25/18   Cathie Hoops, Amy V, PA-C  ipratropium (ATROVENT) 0.06 % nasal spray Place 2 sprays into both nostrils 4 (four) times daily. 10/25/18   Belinda Fisher, PA-C    Family History Family History  Problem Relation Age of Onset  . Cancer Maternal Grandfather     Social History Social History   Tobacco Use  . Smoking status: Never Smoker  . Smokeless tobacco: Never Used  Substance Use Topics  . Alcohol use: Yes    Comment: occasionally  . Drug use: Never     Allergies   Ampicillin   Review of Systems  Review of Systems  Constitutional: Positive for fatigue (improved) and fever (improved). Negative for diaphoresis.  HENT: Negative for ear pain and sore throat.   Eyes: Negative for visual disturbance.  Respiratory: Negative for cough and shortness of breath.   Cardiovascular: Negative for chest pain, palpitations and leg swelling.       Elevated HR.  Gastrointestinal: Negative for abdominal pain, constipation, diarrhea, nausea and vomiting.  Genitourinary: Negative for dysuria.  Musculoskeletal: Negative for back pain.  Skin: Negative for rash.  Neurological: Negative for weakness, numbness and headaches.       Intermittent paresthesias to bilat feet  All other systems reviewed and are negative.    Physical Exam  Updated Vital Signs BP 132/83   Pulse 96   Temp 99.3 F (37.4 C) (Oral)   Resp 16   Ht 6\' 1"  (1.854 m)   Wt 119.3 kg   SpO2 98%   BMI 34.70 kg/m   Physical Exam Vitals signs and nursing note reviewed.  Constitutional:      Appearance: He is well-developed.  HENT:     Head: Normocephalic and atraumatic.  Eyes:     Conjunctiva/sclera: Conjunctivae normal.  Neck:     Musculoskeletal: Neck supple.  Cardiovascular:     Rate and Rhythm: Normal rate and regular rhythm.     Heart sounds: No murmur.     Comments: Intermittently marginally tachycardic on the monitor Pulmonary:     Effort: Pulmonary effort is normal. No respiratory distress.     Breath sounds: Normal breath sounds. No wheezing, rhonchi or rales.  Abdominal:     Palpations: Abdomen is soft.     Tenderness: There is no abdominal tenderness.  Musculoskeletal:        General: No tenderness.     Right lower leg: No edema.     Left lower leg: No edema.     Comments: 5/5 strength to BLE. Normal sensation throughout BLE. DP/PT pulses 2+ and symmetric.   Skin:    General: Skin is warm and dry.  Neurological:     Mental Status: He is alert.      ED Treatments / Results  Labs (all labs ordered are listed, but only abnormal results are displayed) Labs Reviewed  CBC WITH DIFFERENTIAL/PLATELET  BASIC METABOLIC PANEL    EKG EKG Interpretation  Date/Time:  Tuesday Dec 03 2018 13:02:16 EDT Ventricular Rate:  92 PR Interval:    QRS Duration: 96 QT Interval:  337 QTC Calculation: 417 R Axis:   11 Text Interpretation:  Sinus rhythm Borderline T abnormalities, diffuse leads No previous tracing Confirmed by Gwyneth SproutPlunkett, Whitney ([id number]) on 12/03/2018 1:16:34 PM Also confirmed by Gwyneth SproutPlunkett, Whitney ([id number]), editor Elita QuickWatlington, Beverly ([id number])  on 12/03/2018 2:12:24 PM   Radiology Dg Chest Portable 1 View  Result Date: 12/03/2018 CLINICAL DATA:  Cough. EXAM: PORTABLE CHEST 1 VIEW COMPARISON:  None. FINDINGS: The heart size  and mediastinal contours are within normal limits. Both lungs are clear. The visualized skeletal structures are unremarkable. IMPRESSION: No active disease. Electronically Signed   By: Lupita RaiderJames  Green Jr M.D.   On: 12/03/2018 12:31    Procedures Procedures (including critical care time)  Medications Ordered in ED Medications  acetaminophen (TYLENOL) tablet 650 mg (650 mg Oral Given 12/03/18 1247)  sodium chloride 0.9 % bolus 1,000 mL (1,000 mLs Intravenous New Bag/Given 12/03/18 1254)     Initial Impression / Assessment and Plan / ED Course  I have reviewed the triage vital signs and the nursing  notes.  Pertinent labs & imaging results that were available during my care of the patient were reviewed by me and considered in my medical decision making (see chart for details).  Clinical Course as of Dec 05 2154  Tue Dec 03, 2018  1619 D-dimer 3.16    [CG]  1621 D-dimer 3.16    [CG]    Clinical Course User Index [CG] Liberty Handy, PA-C    Final Clinical Impressions(s) / ED Diagnoses   Final diagnoses:  None   Pt presenting to the ED due to concern for intermittent elevated HR that seems to occur with activity. No cp, sob or palpitations. Also with intermittent paresthesias to the bilat feet.  Initially HR 119, improved to the 90s prior to any intervention. Temp 99.26F, otherwise vitals reassuring.   Pt is very well appearing on exam.   CBC pending BMP pending Ddimer pending  EKG with sinus rhythm, Borderline T abnormalities, diffuse leads No previous tracing   CXR is negative  With regard to elevated HR, etiology unclear. Initially tachycardic however HR has remained WNL throughout his visit so far. With his slightly elevated temp and recent h/o illness with fevers I questions whether this could be contributing. EKG reassuring, CXR reassuring. Will obtain ddimer and labs. He denies risk factors for VTE and has no clinical s/s of this currently other than intermittent  tachycardia.   With regards to intermittent paresthesias to the bilat feet, sxs are not present currently. Sxs only occur when the patient has no shoes on. On exam he has normal sensation throughout his BLE and has normal strength. His pulses are also normal. He does not have back pain to suggest radiculopathy. At this point unclear cause, but if labs are normal then pt likely safe for further w/u asn an outpatient for these sxs.  Care transitioned to Sharen Heck, PA-C with plan to f/u on results of lab work and recheck of HR.   ED Discharge Orders    None       Karrie Meres, PA-C 12/03/18 [address] Holman Bonsignore S, PA-C 12/05/18 2156    Gwyneth Sprout, MD 12/09/18 [phone]

## 2019-01-17 ENCOUNTER — Ambulatory Visit: Payer: BC Managed Care – PPO | Admitting: Family Medicine

## 2019-01-29 ENCOUNTER — Telehealth (INDEPENDENT_AMBULATORY_CARE_PROVIDER_SITE_OTHER): Payer: BC Managed Care – PPO | Admitting: Family Medicine

## 2019-01-29 ENCOUNTER — Encounter: Payer: Self-pay | Admitting: Family Medicine

## 2019-01-29 DIAGNOSIS — R000 Tachycardia, unspecified: Secondary | ICD-10-CM | POA: Diagnosis not present

## 2019-01-29 DIAGNOSIS — Z0000 Encounter for general adult medical examination without abnormal findings: Secondary | ICD-10-CM

## 2019-01-29 DIAGNOSIS — R509 Fever, unspecified: Secondary | ICD-10-CM | POA: Diagnosis not present

## 2019-01-29 NOTE — Progress Notes (Signed)
Virtual Visit via Video Note  I connected with Justin Serrano on 01/29/19 at  2:50 PM EDT by a video enabled telemedicine application and verified that I am speaking with the correct person using two identifiers.  Location: Patient: Located at home during today's encounter  Provider: Located at primary care office     I discussed the limitations of evaluation and management by telemedicine and the availability of in person appointments. The patient expressed understanding and agreed to proceed.  History of Present Illness: Justin Serrano is present during today's encounter to establish with a new PCP. Justin Serrano is employed at [employer]. He reports back in [place] developing a febrile illness with accompanying symptoms suspicious for COVID-19.  He reports that the symptoms persisted until approximately the middle of May.  He develops fatigue and cough.  He never was officially tested for COVID-19.  He did self quarantine at home.  During the midst of this illness he developed some tachycardia which is since resolved.  He has no history of any underlying heart condition.  No history of hypertension.  He has completely recovered from illness.  He continues to work from home.  No one within his household has been ill.  He is overdue for physical and is uncertain of the last physical that he had.  Justin Serrano family history includes cancer in his maternal grandfather; Healthy in his father, mother, and sister.  Justin Serrano  reports that he has never smoked. He has never used smokeless tobacco. He reports current alcohol use. He reports that he does not use drugs. Assessment and Plan: 1. Laboratory tests ordered as part of a complete physical exam (CPE) -Schedule fasting labs. -Age specific anticipatory guidance provided.  - Comprehensive metabolic panel; Future - CBC with Differential; Future - Hemoglobin A1c; Future - Thyroid Panel With TSH; Future - Lipid panel; Future  2. Fever, unspecified fever  cause -Resolved since mid May, secondary to viral illness.   3. Tachycardia -Resolved.   Follow Up Instructions: Schedule fasting labs 1-2 weeks, weight, and full vitals.   I discussed the assessment and treatment plan with the patient. The patient was provided an opportunity to ask questions and all were answered. The patient agreed with the plan and demonstrated an understanding of the instructions.   The patient was advised to call back or seek an in-person evaluation if the symptoms worsen or if the condition fails to improve as anticipated.  I provided 25 minutes of non-face-to-face time during this encounter.   Molli Barrows, FNP

## 2019-02-05 ENCOUNTER — Encounter: Payer: Self-pay | Admitting: Family Medicine

## 2019-02-07 ENCOUNTER — Other Ambulatory Visit: Payer: Self-pay

## 2019-02-07 ENCOUNTER — Other Ambulatory Visit: Payer: BC Managed Care – PPO

## 2019-02-07 ENCOUNTER — Encounter: Payer: Self-pay | Admitting: Family Medicine

## 2019-02-07 ENCOUNTER — Ambulatory Visit (INDEPENDENT_AMBULATORY_CARE_PROVIDER_SITE_OTHER): Payer: BC Managed Care – PPO | Admitting: Family Medicine

## 2019-02-07 VITALS — BP 117/79 | HR 75 | Temp 97.2°F | Resp 17 | Ht 73.0 in | Wt 276.8 lb

## 2019-02-07 DIAGNOSIS — R208 Other disturbances of skin sensation: Secondary | ICD-10-CM | POA: Diagnosis not present

## 2019-02-07 DIAGNOSIS — Z13220 Encounter for screening for lipoid disorders: Secondary | ICD-10-CM

## 2019-02-07 DIAGNOSIS — Z1389 Encounter for screening for other disorder: Secondary | ICD-10-CM

## 2019-02-07 DIAGNOSIS — Z130 Encounter for screening for diseases of the blood and blood-forming organs and certain disorders involving the immune mechanism: Secondary | ICD-10-CM

## 2019-02-07 DIAGNOSIS — R202 Paresthesia of skin: Secondary | ICD-10-CM

## 2019-02-07 DIAGNOSIS — Q667 Congenital pes cavus, unspecified foot: Secondary | ICD-10-CM

## 2019-02-07 DIAGNOSIS — Z1329 Encounter for screening for other suspected endocrine disorder: Secondary | ICD-10-CM

## 2019-02-07 DIAGNOSIS — Z131 Encounter for screening for diabetes mellitus: Secondary | ICD-10-CM

## 2019-02-07 DIAGNOSIS — R200 Anesthesia of skin: Secondary | ICD-10-CM

## 2019-02-07 LAB — POCT URINALYSIS DIP (CLINITEK)
Bilirubin, UA: NEGATIVE
Blood, UA: NEGATIVE
Glucose, UA: 100 mg/dL — AB
Ketones, POC UA: NEGATIVE mg/dL
Leukocytes, UA: NEGATIVE
Nitrite, UA: NEGATIVE
POC PROTEIN,UA: NEGATIVE
Spec Grav, UA: >=1.03 — AB (ref 1.010–1.025)
Urobilinogen, UA: 0.2 E.U./dL
pH, UA: 5 (ref 5.0–8.0)

## 2019-02-07 MED ORDER — DICLOFENAC SODIUM 1 % TD GEL: 4.0000 g | Freq: Three times a day (TID) | TRANSDERMAL | 1 refills | Status: DC | PRN

## 2019-02-07 NOTE — Patient Instructions (Addendum)
I have also prescribed topical antiinflammatory to rub on the painful area of the foot up to 3 times daily.  You have been referred to [facility].  Someone from their office will contact you to schedule a new patient appointment for further evaluation of your feet problem.      High Arch Feet  A high arch foot is a condition in which the arch of the foot does not touch the ground when you are standing or walking. As a result, more weight is put on the parts of the foot that touch the ground, including the front, outer side, and heel of the foot. The medical term for this condition is cavus foot. It can affect one foot or both feet. Having high arches can be painful and unstable. What are the causes? This condition may be caused by:  Being born with a high arch in one foot or both feet.  Being born with a foot that turns inward (club foot).  Having a broken foot that does not heal properly.  Having a nervous system (neurological) condition such as stroke, polio, cerebral palsy, or muscular dystrophy. In this case, high arches tend to get worse over time. Sometimes the cause of high arch foot is not known. What are the signs or symptoms? This condition can range from mild to severe. People with mild high arches may have few symptoms. More severe high arches may cause:  An unstable ankle with frequent ankle sprains or strains.  Pain when walking or standing.  Calluses on the front, side, and heel of the foot.  Toe deformities.  The foot to drag (foot drop). This can happen if the high arch is caused by a neurological condition, which can cause the muscles that lift the foot to become weak. How is this diagnosed? This condition is diagnosed based on:  Your medical and family history.  A physical exam. Your health care provider will examine your feet and observe your feet while you walk and stand. Your provider may test your foot strength and movement. X-rays or other  imaging tests may be done to learn more about your condition. If your health care provider suspects that you have a neurological condition, you may need to see a provider who specializes in the nervous system (neurologist). How is this treated? The goal of treatment is to prevent pain and stabilize the foot. First treatments may include:  A shoe insert to support and cushion the foot (orthotic device).  Supportive shoes with high ankle support and a wide base.  A brace to support a weak or unstable ankle.  Physical therapy to strengthen weak muscles and loosen tight muscles. If these treatments do not help, you may need surgery. This is more likely when high arches are caused by a neurological condition that is getting worse. Follow these instructions at home: General instructions  Take over-the-counter and prescription medicines only as told by your health care provider.  Use a shoe insert or supportive shoes as told by your health care provider.  Return to your normal activities as told by your health care provider. Ask your health care provider what activities are safe for you.  Keep all follow-up visits as told by your health care provider. This is important. If you have a brace:  Wear the brace as told by your health care provider. Remove it only as told by your health care provider.  Loosen the brace if your toes tingle, become numb, or turn  cold and blue.  Keep the brace clean.  Do not let the brace get wet if it is not waterproof.  Ask your health care provider when it is safe to drive if you have a brace on your foot. Contact a health care provider if:  You continue to have pain when walking or standing.  Your foot or ankle feels weak and unstable.  You sprain your ankle. Summary  A high arch foot is a condition in which the arch of the foot does not touch the ground when you are standing or walking.  A high arch foot can be painful and unstable.  This  condition is diagnosed with a physical exam and other tests.  Treatment may include shoe inserts, special shoes, bracing, physical therapy, or surgery. This information is not intended to replace advice given to you by your health care provider. Make sure you discuss any questions you have with your health care provider. Document Released: 09/19/2017 Document Revised: 11/07/2018 Document Reviewed: 09/19/2017 Elsevier Patient Education  2020 [employer]

## 2019-02-07 NOTE — Progress Notes (Signed)
Patient ID: Justin Serrano, male    DOB: [birth year], 38 y.o.   MRN: [id number]  PCP: Scot Jun, FNP  No chief complaint on file.   Subjective:  HPI [name] is a 38 y.o. male presents for evaluation of left foot pain.  Patient complains of left mid foot numbness. He characterizes the numbness as improved with wearing of shoes and worsened with bare feet and ambulation. He had very high archs and phalanges 2 through 5 are curved and have been this way for several years.  He is unable to extend any of these toes.  He denies any pain within his toes.  He also reports that he quotation wears out his shoes" within a year's time.  But she is awfully wear on one side only which causes him to have to buy shoes often. He did complain initially of some numbness in the right foot but this resolved without intervention and has not recurred. Patient reports that this is been a problem since early May. He had been in quarantine for over a month beginning in April for symptoms suspicious for COVID-19.  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 He denies any numbness or tingling in any other extremity of the left side of his body.  He is not dizzy or weak. Denies any headaches or any other abnormalities.  Social History   Socioeconomic History  . Marital status: Married    Spouse name: Not on file  . Number of children: Not on file  . Years of education: Not on file  . Highest education level: Not on file  Occupational History  . Not on file  Social Needs  . Financial resource strain: Not on file  . Food insecurity    Worry: Not on file    Inability: Not on file  . Transportation needs    Medical: Not on file    Non-medical: Not on file  Tobacco Use  . Smoking status: Never Smoker  . Smokeless tobacco: Never Used  Substance and Sexual Activity  . Alcohol  use: Yes    Comment: occasionally  . Drug use: Never  . Sexual activity: Not on file  Lifestyle  . Physical activity    Days per week: Not on file    Minutes per session: Not on file  . Stress: Not on file  Relationships  . Social Herbalist on phone: Not on file    Gets together: Not on file    Attends religious service: Not on file    Active member of club or organization: Not on file    Attends meetings of clubs or organizations: Not on file    Relationship status: Not on file  . Intimate partner violence    Fear of current or ex partner: Not on file    Emotionally abused: Not on file    Physically abused: Not on file    Forced sexual activity: Not on file  Other Topics Concern  . Not on file  Social History Narrative  . Not on file    Family History  Problem Relation Age of Onset  . Healthy Mother   . Healthy Father   . Healthy Sister   . Cancer Maternal Grandfather   . Heart disease Neg Hx   . Heart attack Neg Hx   . Stroke Neg Hx  Review of Systems Pertinent negatives listed in HPI Allergies  Allergen Reactions  . Ampicillin     Prior to Admission medications   Not on File   Past Medical, Surgical Family and Social History reviewed and updated.  Objective:   Today's Vitals   02/07/19 0841  BP: 117/79  Pulse: 75  Resp: 17  Temp: (!) 97.2 F (36.2 C)  TempSrc: Temporal  SpO2: 97%  Weight: 276 lb 12.8 oz (125.6 kg)  Height: 6\' 1"  (1.854 m)    BP Readings from Last 3 Encounters:  12/03/18 124/83  10/25/18 (!) 141/93  03/12/18 134/84    Filed Weights   02/07/19 0841  Weight: 276 lb 12.8 oz (125.6 kg)      Physical Exam General appearance: alert, well developed, well nourished, cooperative and in no distress Head: Normocephalic, without obvious abnormality, atraumatic Respiratory: Respirations even and unlabored, normal respiratory rate Heart: rate and rhythm normal. No gallop or murmurs noted on exam  Abdomen: BS +, no  distention, no rebound tenderness, or no mass Extremities: left foot elevated arch, skin intact Skin: Skin color, texture, turgor normal. No rashes seen  Psych: Appropriate mood and affect. Neurologic: Alert, oriented to person, place, and time, thought content appropriate.   Assessment & Plan:  1. Screening for blood or protein in urine - POCT URINALYSIS DIP (CLINITEK)  2. Screening for deficiency anemia - CBC with Differential  3. Lipid screening - Lipid Panel  4. Thyroid disorder screen - TSH  5. Diabetes mellitus screening - Comprehensive metabolic panel - Hemoglobin A1c  6. High arches, congenital Patient warrants a follow-up with a podiatry. - Ambulatory referral to Podiatry  7. Numbness of left foot - Will trial Diclofenac topical as needed for foot discomfort. - Ambulatory referral to Podiatry   Patient will obtain fasting labs today as a follow-up from previous phone encounter.  Visit was initially a lab visit changed into an acute visit.    Joaquin CourtsKimberly Olene Godfrey, FNP-C [facility] [address], [place] [id number] [phone]: [phone]

## 2019-02-08 LAB — COMPREHENSIVE METABOLIC PANEL
ALT: 59 IU/L — ABNORMAL HIGH (ref 0–44)
AST: 45 IU/L — ABNORMAL HIGH (ref 0–40)
Albumin/Globulin Ratio: 1.9 (ref 1.2–2.2)
Albumin: 4.5 g/dL (ref 4.0–5.0)
Alkaline Phosphatase: 49 IU/L (ref 39–117)
BUN/Creatinine Ratio: 16 (ref 9–20)
BUN: 13 mg/dL (ref 6–20)
Bilirubin Total: 0.7 mg/dL (ref 0.0–1.2)
CO2: 26 mmol/L (ref 20–29)
Calcium: 9.8 mg/dL (ref 8.7–10.2)
Chloride: 104 mmol/L (ref 96–106)
Creatinine, Ser: 0.83 mg/dL (ref 0.76–1.27)
GFR calc Af Amer: 130 mL/min/{1.73_m2} (ref >59)
GFR calc non Af Amer: 112 mL/min/{1.73_m2} (ref >59)
Globulin, Total: 2.4 g/dL (ref 1.5–4.5)
Glucose: 91 mg/dL (ref 65–99)
Potassium: 4.3 mmol/L (ref 3.5–5.2)
Sodium: 143 mmol/L (ref 134–144)
Total Protein: 6.9 g/dL (ref 6.0–8.5)

## 2019-02-08 LAB — CBC WITH DIFFERENTIAL/PLATELET
Basophils Absolute: 0 10*3/uL (ref 0.0–0.2)
Basos: 1 %
EOS (ABSOLUTE): 0.2 10*3/uL (ref 0.0–0.4)
Eos: 3 %
Hematocrit: 47.2 % (ref 37.5–51.0)
Hemoglobin: 14.9 g/dL (ref 13.0–17.7)
Immature Grans (Abs): 0 10*3/uL (ref 0.0–0.1)
Immature Granulocytes: 0 %
Lymphocytes Absolute: 3.6 10*3/uL — ABNORMAL HIGH (ref 0.7–3.1)
Lymphs: 64 %
MCH: 29.2 pg (ref 26.6–33.0)
MCHC: 31.6 g/dL (ref 31.5–35.7)
MCV: 92 fL (ref 79–97)
Monocytes Absolute: 0.5 10*3/uL (ref 0.1–0.9)
Monocytes: 10 %
Neutrophils Absolute: 1.2 10*3/uL — ABNORMAL LOW (ref 1.4–7.0)
Neutrophils: 22 %
Platelets: 183 10*3/uL (ref 150–450)
RBC: 5.11 x10E6/uL (ref 4.14–5.80)
RDW: 14.6 % (ref 11.6–15.4)
WBC: 5.5 10*3/uL (ref 3.4–10.8)

## 2019-02-08 LAB — LIPID PANEL
Chol/HDL Ratio: 3.2 ratio (ref 0.0–5.0)
Cholesterol, Total: 136 mg/dL (ref 100–199)
HDL: 42 mg/dL (ref >39)
LDL Calculated: 73 mg/dL (ref 0–99)
Triglycerides: 104 mg/dL (ref 0–149)
VLDL Cholesterol Cal: 21 mg/dL (ref 5–40)

## 2019-02-08 LAB — HEMOGLOBIN A1C
Est. average glucose Bld gHb Est-mCnc: 114 mg/dL
Hgb A1c MFr Bld: 5.6 % (ref 4.8–5.6)

## 2019-02-08 LAB — TSH: TSH: 1.56 u[IU]/mL (ref 0.450–4.500)

## 2019-02-09 ENCOUNTER — Telehealth: Payer: Self-pay | Admitting: Family Medicine

## 2019-02-09 DIAGNOSIS — R748 Abnormal levels of other serum enzymes: Secondary | ICD-10-CM

## 2019-02-09 DIAGNOSIS — K769 Liver disease, unspecified: Secondary | ICD-10-CM

## 2019-02-09 NOTE — Telephone Encounter (Signed)
Please contact patient to schedule a liver ultrasound. Order in system. Mychart message sent to patient to advise of elevated liver enzymes.

## 2019-02-11 NOTE — Telephone Encounter (Signed)
Patient is scheduled for 02/03/2019 @ 8 AM. 7:45 AM arrival time. Nothing to eat or drink after midnight.

## 2019-02-11 NOTE — Telephone Encounter (Signed)
Patient notified of appointment info.

## 2019-02-13 ENCOUNTER — Ambulatory Visit (HOSPITAL_COMMUNITY)
Admission: RE | Admit: 2019-02-13 | Discharge: 2019-02-13 | Disposition: A | Discharge status: Home / Self Care | Payer: BC Managed Care – PPO | Source: Ambulatory Visit | Attending: Family Medicine | Admitting: Family Medicine

## 2019-02-13 ENCOUNTER — Other Ambulatory Visit: Payer: Self-pay

## 2019-02-13 DIAGNOSIS — R748 Abnormal levels of other serum enzymes: Secondary | ICD-10-CM | POA: Diagnosis present

## 2019-02-13 LAB — SARS-COV-2 ANTIBODIES: Coronavirus 229E: NEGATIVE

## 2019-02-14 ENCOUNTER — Encounter: Payer: Self-pay | Admitting: Family Medicine

## 2019-02-17 NOTE — Addendum Note (Signed)
Addended by: Scot Jun on: 02/17/2019 11:02 AM   Modules accepted: Orders

## 2019-02-17 NOTE — Telephone Encounter (Addendum)
Called [phone] to see if CPT [phone] needs prior authorization. Spoke with Almyra Free. Authorization was approved. Valid 02/17/2019-08/15/2019. Authorization number [id number].

## 2019-02-17 NOTE — Telephone Encounter (Signed)
Here patient notified of abnormal ultrasound via MyChart.  Please schedule an MRI of the liver with and without contrast orders in the system.  Order would need to be transferred to a provider who will be providing care for patient due to my absence.  Patient also notified that you will have to obtain prior authorization before scheduling an MRI.  This is a nonemergent study.  Reason for study is to identify the 2 cm lesion noted on ultrasound

## 2019-02-17 NOTE — Telephone Encounter (Signed)
MRI scheduled 02/20/2019 @ 8 AM at [facility]. 7:30 AM arrival. NPO after midnight. Will notify patient via [place].

## 2019-02-20 ENCOUNTER — Other Ambulatory Visit: Payer: Self-pay

## 2019-02-20 ENCOUNTER — Ambulatory Visit (HOSPITAL_COMMUNITY)
Admission: RE | Admit: 2019-02-20 | Discharge: 2019-02-20 | Disposition: A | Discharge status: Home / Self Care | Payer: BC Managed Care – PPO | Source: Ambulatory Visit | Attending: Family Medicine | Admitting: Family Medicine

## 2019-02-20 DIAGNOSIS — K769 Liver disease, unspecified: Secondary | ICD-10-CM | POA: Diagnosis present

## 2019-02-20 MED ORDER — GADOBUTROL 1 MMOL/ML IV SOLN
10.0000 mL | Freq: Once | INTRAVENOUS | Status: AC | PRN
  Administered 2019-02-20: 10 mL via INTRAVENOUS

## 2019-02-25 ENCOUNTER — Ambulatory Visit (INDEPENDENT_AMBULATORY_CARE_PROVIDER_SITE_OTHER): Payer: BC Managed Care – PPO

## 2019-02-25 ENCOUNTER — Other Ambulatory Visit: Payer: Self-pay | Admitting: Sports Medicine

## 2019-02-25 ENCOUNTER — Ambulatory Visit: Payer: BC Managed Care – PPO | Admitting: Sports Medicine

## 2019-02-25 ENCOUNTER — Other Ambulatory Visit: Payer: Self-pay

## 2019-02-25 ENCOUNTER — Encounter: Payer: Self-pay | Admitting: Sports Medicine

## 2019-02-25 VITALS — BP 113/67 | HR 66 | Temp 96.4°F | Resp 16

## 2019-02-25 DIAGNOSIS — M729 Fibroblastic disorder, unspecified: Secondary | ICD-10-CM | POA: Diagnosis not present

## 2019-02-25 DIAGNOSIS — M216X1 Other acquired deformities of right foot: Secondary | ICD-10-CM

## 2019-02-25 DIAGNOSIS — M216X2 Other acquired deformities of left foot: Secondary | ICD-10-CM

## 2019-02-25 DIAGNOSIS — M2040 Other hammer toe(s) (acquired), unspecified foot: Secondary | ICD-10-CM

## 2019-02-25 DIAGNOSIS — M79671 Pain in right foot: Secondary | ICD-10-CM | POA: Diagnosis not present

## 2019-02-25 DIAGNOSIS — M792 Neuralgia and neuritis, unspecified: Secondary | ICD-10-CM

## 2019-02-25 DIAGNOSIS — M79672 Pain in left foot: Secondary | ICD-10-CM

## 2019-02-25 NOTE — Progress Notes (Signed)
Subjective: Justin Serrano is a 38 y.o. male patient who presents to office for evaluation of high arches. Reports that he doesn't have pain but did have an episode of numbness in both L>R that is now resolved that his PCP gave him voltaren for but hasn't use it. Patient denies any other pedal complaints. Denies injury/trip/fall/sprain/any causative factors.   Review of Systems  All other systems reviewed and are negative.    There are no active problems to display for this patient.   Current Outpatient Medications on File Prior to Visit  Medication Sig Dispense Refill  . diclofenac sodium (VOLTAREN) 1 % GEL Apply 4 g topically 3 (three) times daily as needed. 100 g 1   No current facility-administered medications on file prior to visit.     Allergies  Allergen Reactions  . Ampicillin     Objective:  General: Alert and oriented x3 in no acute distress  Dermatology: No open lesions bilateral lower extremities, no webspace macerations, no ecchymosis bilateral, all nails x 10 are well manicured.  Vascular: Dorsalis Pedis and Posterior Tibial pedal pulses palpable, Capillary Fill Time 3 seconds,(+) pedal hair growth bilateral, no edema bilateral lower extremities, Temperature gradient within normal limits.  Neurology: Gross sensation intact via light touch bilateral, - tinels sign bilateral.  Musculoskeletal: No reproducible tenderness to palpation bilateral. Pes cavus and hammertoe deformity bilateral. Strength within normal limits in all groups bilateral.   Gait: Non-Antalgic gait  Xrays  Left and right Foot   Impression: Normal osseous mineralization, flexible cavus foot type bilateral. Midfoot arthritis. No other acute findings.   Assessment and Plan: Problem List Items Addressed This Visit    None    Visit Diagnoses    Pain in both feet    -  Primary   Relevant Orders   DG Foot Complete Right   DG Foot Complete Left   Acquired bilateral pes cavus       Hammer toe,  unspecified laterality       Fasciitis       Neuritis           -Complete examination performed -Xrays reviewed -Discussed treatement options for cavus foot type -Rx recommend custom orthotics and good supportive shoes -May use diclofenac topical PRN -Patient to return to office when called for orthotics or sooner if condition worsens.  Landis Martins, DPM

## 2019-02-26 NOTE — Progress Notes (Signed)
Patient notified of MRI results & recommendations. Expressed understanding.

## 2019-03-04 ENCOUNTER — Ambulatory Visit (INDEPENDENT_AMBULATORY_CARE_PROVIDER_SITE_OTHER): Payer: BC Managed Care – PPO | Admitting: Orthotics

## 2019-03-04 ENCOUNTER — Other Ambulatory Visit: Payer: Self-pay

## 2019-03-04 DIAGNOSIS — M2040 Other hammer toe(s) (acquired), unspecified foot: Secondary | ICD-10-CM

## 2019-03-04 DIAGNOSIS — M216X1 Other acquired deformities of right foot: Secondary | ICD-10-CM

## 2019-03-04 DIAGNOSIS — M79671 Pain in right foot: Secondary | ICD-10-CM

## 2019-03-04 DIAGNOSIS — M216X2 Other acquired deformities of left foot: Secondary | ICD-10-CM | POA: Diagnosis not present

## 2019-03-04 NOTE — Progress Notes (Signed)
Patient came in today for evaluation/assessment custom foot orthotics.  Patient presents foot pain and discomfort associated with plantar fasciitis.  Patient has noted pes cavus foot type with also rear foot varus deformity. ...Goal is to "bring ground up" with contoured arch support, and 4 degree valgus RF and FF posting.     

## 2019-03-25 ENCOUNTER — Ambulatory Visit: Payer: BC Managed Care – PPO | Admitting: Orthotics

## 2019-03-25 ENCOUNTER — Other Ambulatory Visit: Payer: Self-pay

## 2019-03-25 DIAGNOSIS — M79672 Pain in left foot: Secondary | ICD-10-CM

## 2019-03-25 DIAGNOSIS — M216X1 Other acquired deformities of right foot: Secondary | ICD-10-CM

## 2019-03-25 DIAGNOSIS — M216X2 Other acquired deformities of left foot: Secondary | ICD-10-CM

## 2019-03-25 DIAGNOSIS — M79671 Pain in right foot: Secondary | ICD-10-CM

## 2019-03-25 DIAGNOSIS — M2040 Other hammer toe(s) (acquired), unspecified foot: Secondary | ICD-10-CM

## 2019-03-25 NOTE — Progress Notes (Signed)
Patient came in today to p/up functional foot orthotics.   The orthotics were assessed to both fit and function.  The F/O addressed the biomechanical issues/pathologies as intended, offering good longitudinal arch support, proper offloading, and foot support. There weren't any signs of discomfort or irritation.  The F/O fit properly in footwear with minimal trimming/adjustments. 

## 2019-08-09 IMAGING — DX PORTABLE CHEST - 1 VIEW
1 series · 1 of 1 positions shown · non-contrast
Comparison: None.

CLINICAL DATA: Cough.

EXAM:
PORTABLE CHEST 1 VIEW

[chest ap]
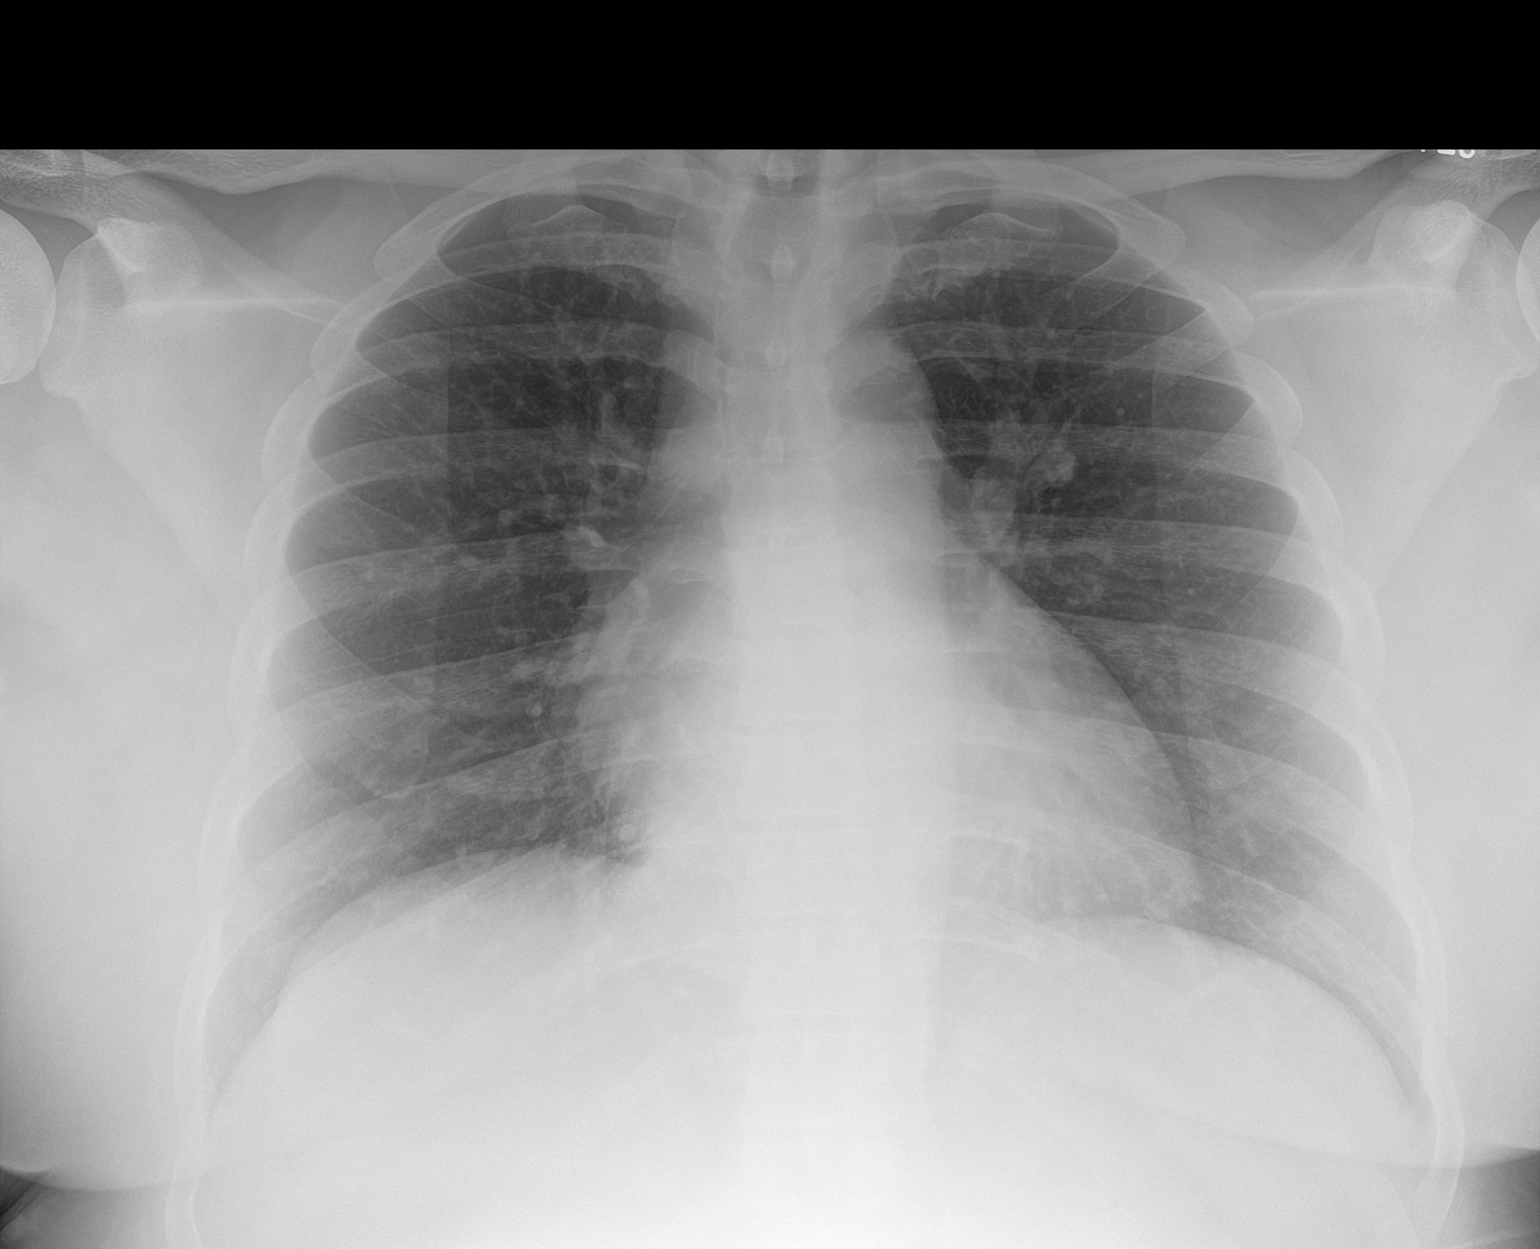

[1 of 1 positions shown; findings below may reference images not displayed]

FINDINGS: The heart size and mediastinal contours are within normal limits.
Both lungs are clear. The visualized skeletal structures are
unremarkable.
IMPRESSION: No active disease.

## 2019-08-09 IMAGING — CT CT ANGIOGRAPHY CHEST
2 of 8 series · 18 of 46 positions shown · IV contrast (omnipaque)
Comparison: None.

CLINICAL DATA: Pt here after finishing self-quarantine for
potential covid on [DATE]st. Has no symptoms now. But since then he
Kleidi his resting heart rate as been higher and he has tingling in
bilateral feet.

EXAM:
CT ANGIOGRAPHY CHEST WITH CONTRAST
TECHNIQUE: Multidetector CT imaging of the chest was performed using the
standard protocol during bolus administration of intravenous
contrast. Multiplanar CT image reconstructions and MIPs were
obtained to evaluate the vascular anatomy.
CONTRAST:  100mL OMNIPAQUE IOHEXOL 350 MG/ML SOLN

[Series 6: thins · axial · 0.71mm/px · z∈[-184,+36]mm · 15 of 243 slices shown]
[im 12/243  lung]
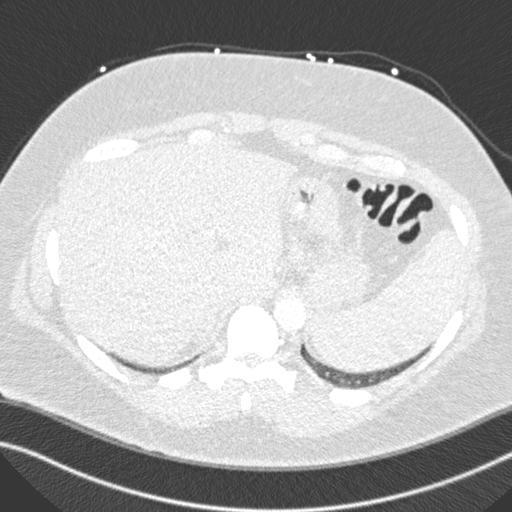
[im 34/243  soft-tissue]
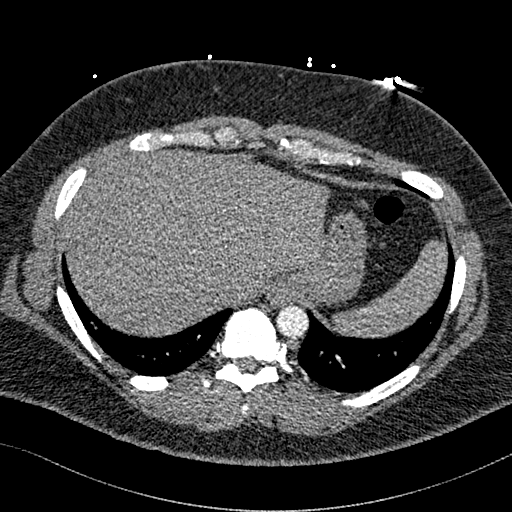
[im 45/243  lung]
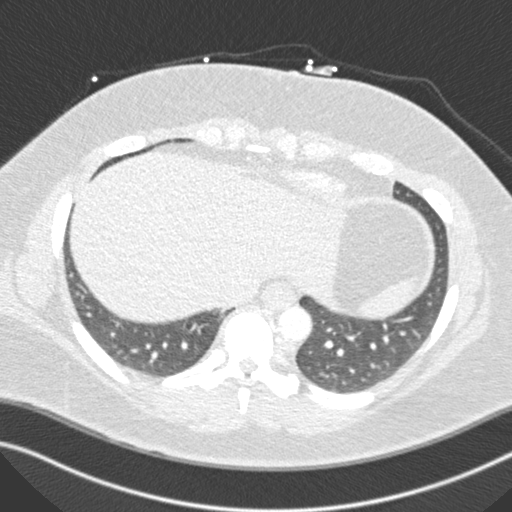
[im 56/243  soft-tissue]
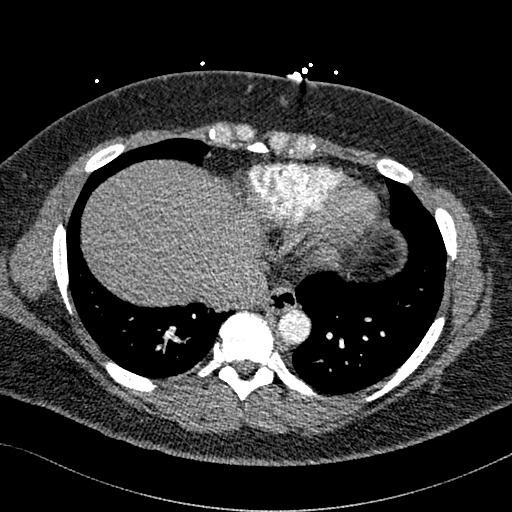
[im 78/243  lung]
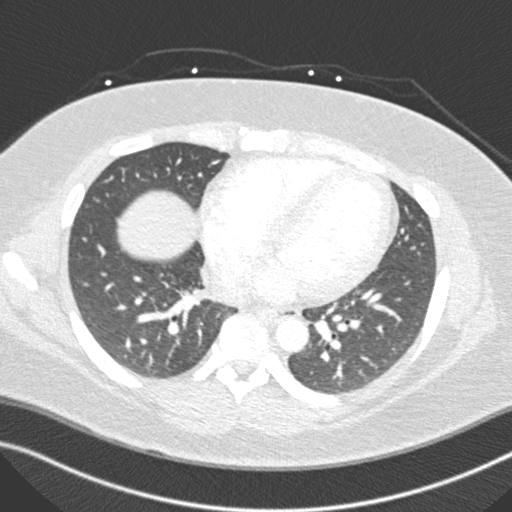
[im 89/243  soft-tissue]
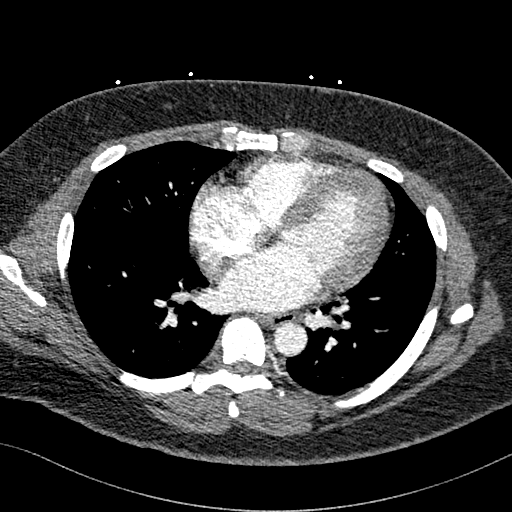
[im 111/243  lung]
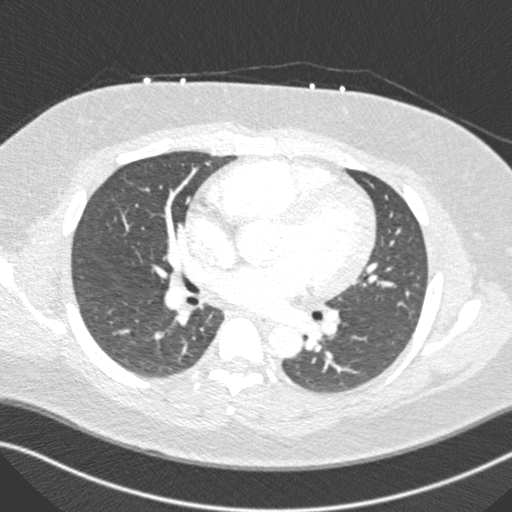
[im 122/243  soft-tissue]
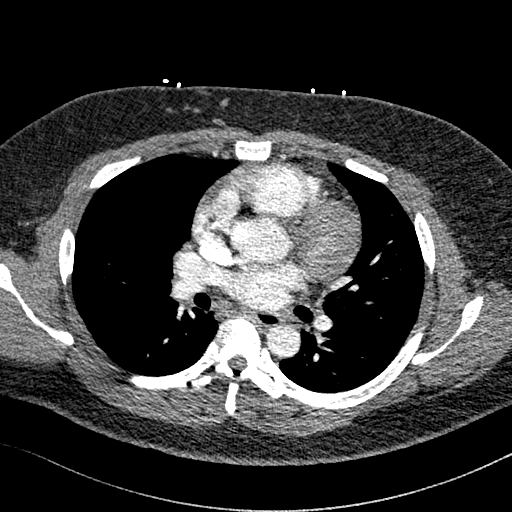
[im 133/243  lung]
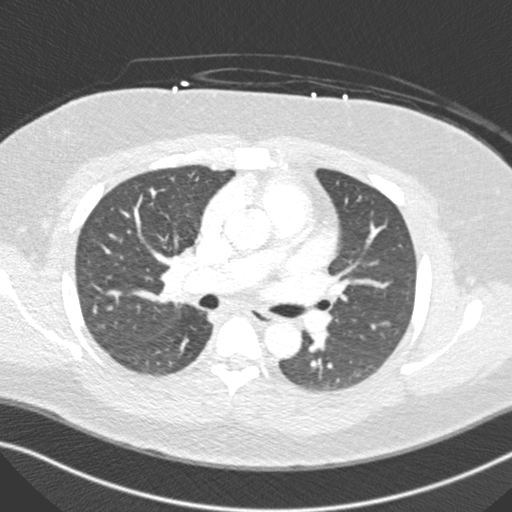
[im 155/243  soft-tissue]
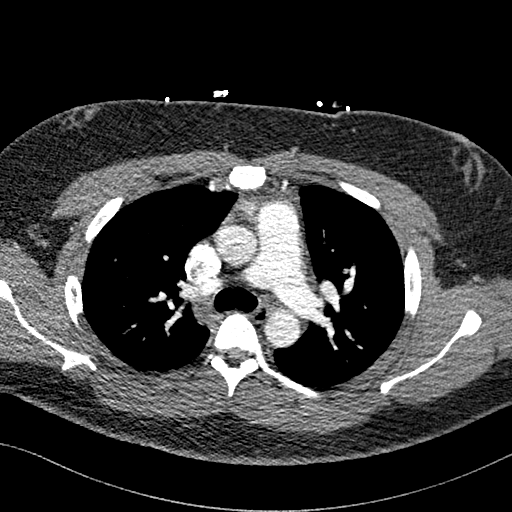
[im 166/243  lung]
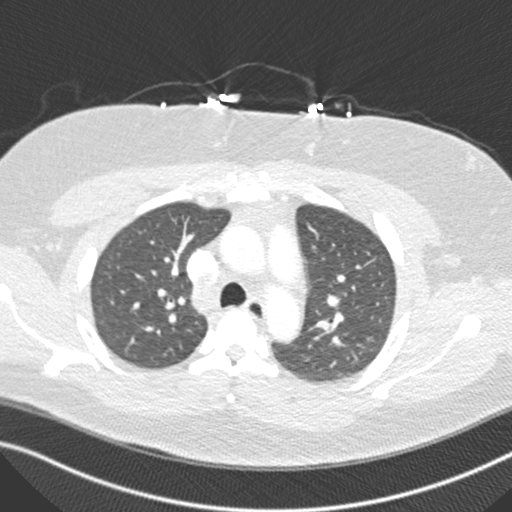
[im 188/243  soft-tissue]
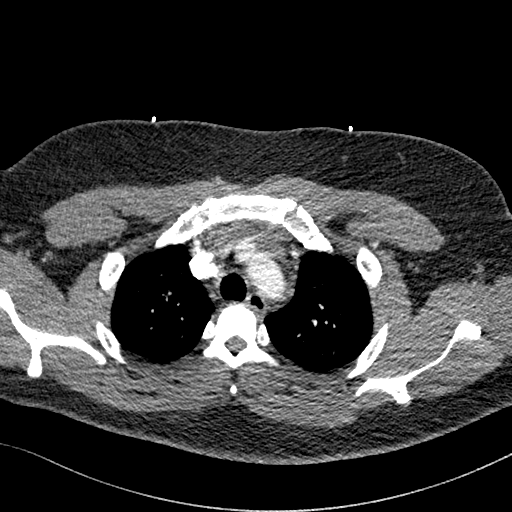
[im 199/243  lung]
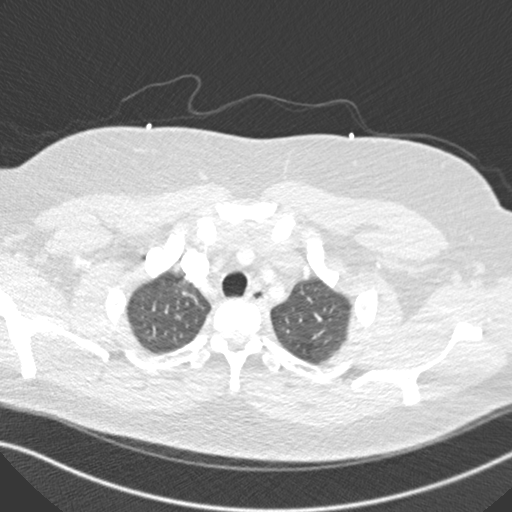
[im 210/243  soft-tissue]
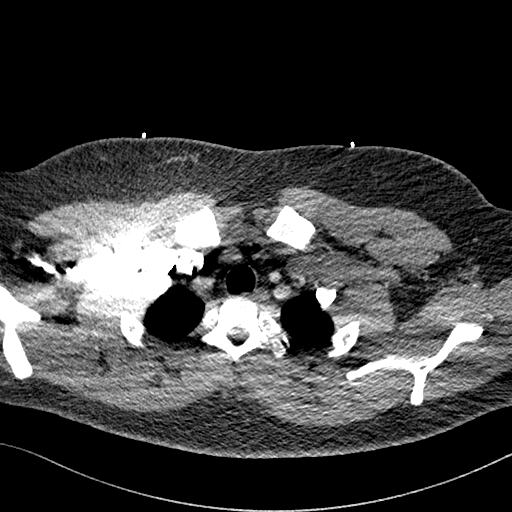
[im 232/243  lung]
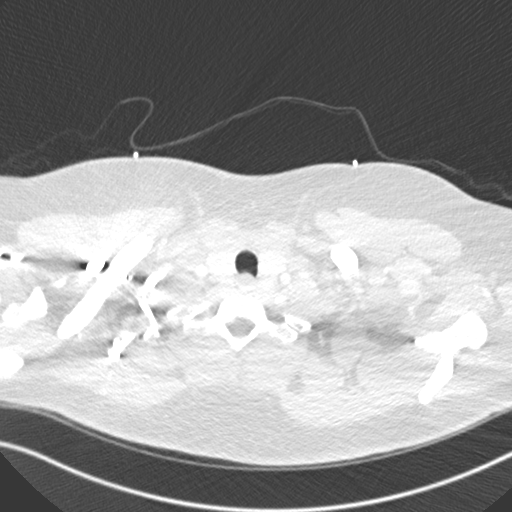

[Series 8: coronal mpr · coronal · 0.52mm/px · 3 of 111 slices shown]
[im 28/111  soft-tissue]
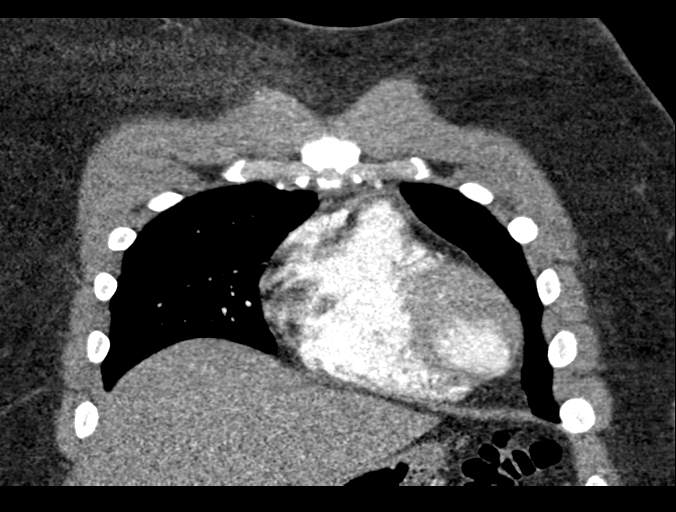
[im 56/111  soft-tissue]
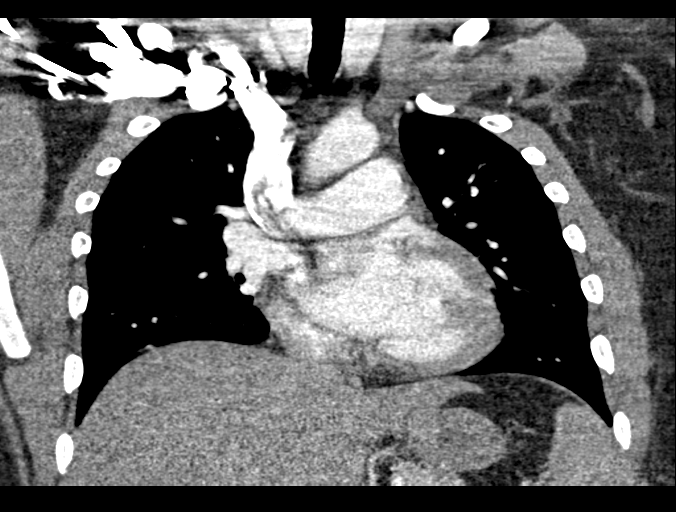
[im 83/111  soft-tissue]
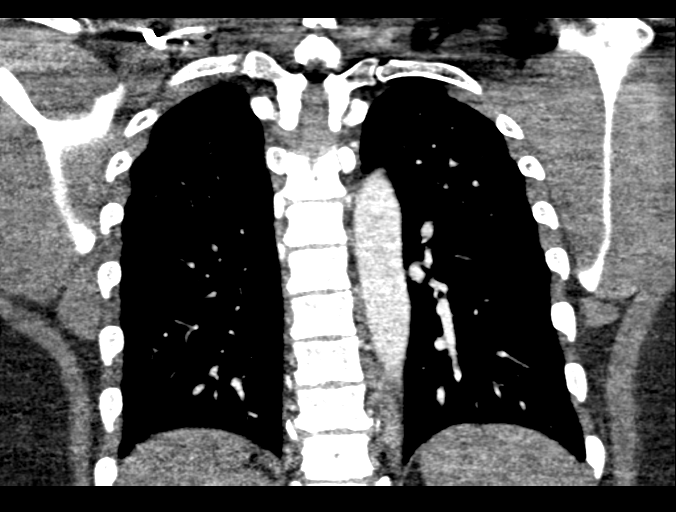

[18 of 46 positions shown; findings below may reference images not displayed]

FINDINGS: Cardiovascular: Satisfactory opacification of the pulmonary arteries
to the segmental level. No evidence of pulmonary embolism. Normal
heart size. No pericardial effusion. Thoracic aorta is normal in
caliber.

Mediastinum/Nodes: No enlarged mediastinal, hilar, or axillary lymph
nodes. Thyroid gland, trachea, and esophagus demonstrate no
significant findings.

Lungs/Pleura: Lungs are clear. No pleural effusion or pneumothorax.

Upper Abdomen: No acute abnormality.

Musculoskeletal: No chest wall abnormality. No acute or significant
osseous findings.

Review of the MIP images confirms the above findings.
IMPRESSION: 1. No evidence of pulmonary embolus.
2. No acute cardiopulmonary disease.

## 2019-10-04 ENCOUNTER — Ambulatory Visit: Payer: BC Managed Care – PPO | Attending: Internal Medicine

## 2019-10-04 DIAGNOSIS — Z23 Encounter for immunization: Secondary | ICD-10-CM | POA: Insufficient documentation

## 2019-10-04 NOTE — Progress Notes (Signed)
   Covid-19 Vaccination Clinic  Name:  Justin Serrano    MRN: [id number] DOB: March 28, 1981  10/04/2019  Justin Serrano was observed post Covid-19 immunization foe 15 minutes without incident. He was provided with Vaccine Information Sheet and instruction to access the V-Safe system.   Justin Serrano was instructed to call 911 with any severe reactions post vaccine: Marland Kitchen Difficulty breathing  . Swelling of face and throat  . A fast heartbeat  . A bad rash all over body  . Dizziness and weakness   Immunizations Administered    Name Date Dose VIS Date Route   Pfizer COVID-19 Vaccine 10/04/2019  4:20 PM 0.3 mL 07/11/2019 Intramuscular   Manufacturer: [employer], [employer]   Lot: [id number]   NDC: [address]-7

## 2019-10-25 ENCOUNTER — Ambulatory Visit: Payer: BC Managed Care – PPO | Attending: Internal Medicine

## 2019-10-25 DIAGNOSIS — Z23 Encounter for immunization: Secondary | ICD-10-CM

## 2019-10-25 NOTE — Progress Notes (Signed)
   Covid-19 Vaccination Clinic  Name:  JOSAFAT ENRICO    MRN: [id number] DOB: 12-07-1980  10/25/2019  Mr. Burley was observed post Covid-19 immunization for 15 minutes without incident. He was provided with Vaccine Information Sheet and instruction to access the V-Safe system.   Mr. Axtman was instructed to call 911 with any severe reactions post vaccine: Marland Kitchen Difficulty breathing  . Swelling of face and throat  . A fast heartbeat  . A bad rash all over body  . Dizziness and weakness   Immunizations Administered    Name Date Dose VIS Date Route   Pfizer COVID-19 Vaccine 10/25/2019  3:34 PM 0.3 mL 07/11/2019 Intramuscular   Manufacturer: [employer], [employer]   Lot: [id number]   NDC: [address]-8

## 2020-03-09 ENCOUNTER — Encounter: Payer: Self-pay | Admitting: Internal Medicine

## 2020-03-09 ENCOUNTER — Ambulatory Visit (INDEPENDENT_AMBULATORY_CARE_PROVIDER_SITE_OTHER): Payer: BC Managed Care – PPO | Admitting: Internal Medicine

## 2020-03-09 ENCOUNTER — Other Ambulatory Visit: Payer: Self-pay

## 2020-03-09 VITALS — BP 127/84 | HR 89 | Temp 97.3°F | Resp 17 | Wt 288.0 lb

## 2020-03-09 DIAGNOSIS — Z1159 Encounter for screening for other viral diseases: Secondary | ICD-10-CM

## 2020-03-09 DIAGNOSIS — R7989 Other specified abnormal findings of blood chemistry: Secondary | ICD-10-CM | POA: Diagnosis not present

## 2020-03-09 DIAGNOSIS — Z0000 Encounter for general adult medical examination without abnormal findings: Secondary | ICD-10-CM

## 2020-03-09 NOTE — Patient Instructions (Signed)
Thank you for choosing Primary Care at [facility] to be your medical home!    Justin Serrano was seen by De Hollingshead, DO today.   Justin Serrano's primary care provider is Marcy Siren, DO.   For the best care possible, you should try to see Marcy Siren, DO whenever you come to the clinic.   We look forward to seeing you again soon!  If you have any questions about your visit today, please call us at [phone] or feel free to reach your primary care provider via MyChart.   Keeping you healthy  Get these tests  Blood pressure- Have your blood pressure checked once a year by your healthcare provider.  Normal blood pressure is 120/80.  Weight- Have your body mass index (BMI) calculated to screen for obesity.  BMI is a measure of body fat based on height and weight. You can also calculate your own BMI at https://www.west-esparza.com/.  Cholesterol- Have your cholesterol checked regularly starting at age 45, sooner may be necessary if you have diabetes, high blood pressure, if a family member developed heart diseases at an early age or if you smoke.   Chlamydia, HIV, and other sexual transmitted disease- Get screened each year until the age of 22 then within three months of each new sexual partner.  Diabetes- Have your blood sugar checked regularly if you have high blood pressure, high cholesterol, a family history of diabetes or if you are overweight.  Get these vaccines  Flu shot- Every fall.  Tetanus shot- Every 10 years.  Menactra- Single dose; prevents meningitis.  Take these steps  Don't smoke- If you do smoke, ask your healthcare provider about quitting. For tips on how to quit, go to www.smokefree.gov or call 1-800-QUIT-NOW.  Be physically active- Exercise 5 days a week for at least 30 minutes.  If you are not already physically active start slow and gradually work up to 30 minutes of moderate physical activity.  Examples of moderate activity include  walking briskly, mowing the yard, dancing, swimming bicycling, etc.  Eat a healthy diet- Eat a variety of healthy foods such as fruits, vegetables, low fat milk, low fat cheese, yogurt, lean meats, poultry, fish, beans, tofu, etc.  For more information on healthy eating, go to www.thenutritionsource.org  Drink alcohol in moderation- Limit alcohol intake two drinks or less a day.  Never drink and drive.  Dentist- Brush and floss teeth twice daily; visit your dentis twice a year.  Depression-Your emotional health is as important as your physical health.  If you're feeling down, losing interest in things you normally enjoy please talk with your healthcare provider.  Gun Safety- If you keep a gun in your home, keep it unloaded and with the safety lock on.  Bullets should be stored separately.  Helmet use- Always wear a helmet when riding a motorcycle, bicycle, rollerblading or skateboarding.  Safe sex- If you may be exposed to a sexually transmitted infection, use a condom  Seat belts- Seat bels can save your life; always wear one.  Smoke/Carbon Monoxide detectors- These detectors need to be installed on the appropriate level of your home.  Replace batteries at least once a year.  Skin Cancer- When out in the sun, cover up and use sunscreen SPF 15 or higher.  Violence- If anyone is threatening or hurting you, please tell your healthcare provider.

## 2020-03-09 NOTE — Progress Notes (Signed)
  Subjective:    Justin Serrano - 39 y.o. male MRN [id number]  Date of birth: [birth year]  HPI  Justin Serrano is here for annual exam. Patient received COVID vaccination. Gets flu vaccine yearly since birth of his daughter. Has 66.71 year old daughter and 54 month old daughter.   Works for [employer]. 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 Starts back on Tuesday.   Not doing as much as he was for exercise. Was trying to do 3-4x on bike per week and then a weekly walk. New child has thrown that off for a bit.      Health Maintenance:  Health Maintenance Due  Topic Date Due  . Hepatitis C Screening  Never done  . INFLUENZA VACCINE  02/29/2020    -  reports that he has never smoked. He has never used smokeless tobacco. - Review of Systems: Per HPI. - Past Medical History: There are no problems to display for this patient.  - Medications: reviewed and updated   Objective:   Physical Exam BP 127/84   Pulse 89   Temp (!) 97.3 F (36.3 C) (Temporal)   Resp 17   Wt 288 lb (130.6 kg)   SpO2 97%   BMI 38.00 kg/m  Physical Exam Constitutional:      Appearance: He is not diaphoretic.  HENT:     Head: Normocephalic and atraumatic.  Eyes:     Conjunctiva/sclera: Conjunctivae normal.     Pupils: Pupils are equal, round, and reactive to light.  Neck:     Thyroid: No thyromegaly.  Cardiovascular:     Rate and Rhythm: Normal rate and regular rhythm.     Heart sounds: Normal heart sounds. No murmur heard.   Pulmonary:     Effort: Pulmonary effort is normal. No respiratory distress.     Breath sounds: Normal breath sounds. No wheezing.  Abdominal:     General: Bowel sounds are normal. There is no distension.     Palpations: Abdomen is soft.     Tenderness: There is no abdominal tenderness. There is no guarding or rebound.  Musculoskeletal:        General: No deformity. Normal range of motion.     Cervical back: Normal range of motion and neck supple.  Lymphadenopathy:     Cervical: No  cervical adenopathy.  Skin:    General: Skin is warm and dry.     Findings: No rash.  Neurological:     Mental Status: He is alert and oriented to person, place, and time.     Gait: Gait is intact.  Psychiatric:        Mood and Affect: Mood and affect normal.        Judgment: Judgment normal.            Assessment & Plan:  1. Annual physical exam Counseled on 150 minutes of exercise per week, healthy eating (including decreased daily intake of saturated fats, cholesterol, added sugars, sodium), STI prevention, routine healthcare maintenance. - CBC with Differential - Comprehensive metabolic panel - Lipid panel  2. Elevated LFTs Prior minimally elevated LFTs in 2019 and 2020. Due to chronicity, further work up was obtained. Benign hemangioma was noted on liver ultrasound. Will continue to monitor.  - Comprehensive metabolic panel  3. Need for hepatitis C screening test - HCV Ab w/Rflx to Verification     Marcy Siren, D.O. 03/09/2020, 3:02 PM Primary Care at [facility]

## 2020-03-10 LAB — COMPREHENSIVE METABOLIC PANEL
ALT: 32 IU/L (ref 0–44)
AST: 40 IU/L (ref 0–40)
Albumin/Globulin Ratio: 1.6 (ref 1.2–2.2)
Albumin: 4.7 g/dL (ref 4.0–5.0)
Alkaline Phosphatase: 50 IU/L (ref 48–121)
BUN/Creatinine Ratio: 13 (ref 9–20)
BUN: 10 mg/dL (ref 6–20)
Bilirubin Total: 0.7 mg/dL (ref 0.0–1.2)
CO2: 27 mmol/L (ref 20–29)
Calcium: 9.8 mg/dL (ref 8.7–10.2)
Chloride: 105 mmol/L (ref 96–106)
Creatinine, Ser: 0.8 mg/dL (ref 0.76–1.27)
GFR calc Af Amer: 131 mL/min/{1.73_m2} (ref >59)
GFR calc non Af Amer: 113 mL/min/{1.73_m2} (ref >59)
Globulin, Total: 2.9 g/dL (ref 1.5–4.5)
Glucose: 105 mg/dL — ABNORMAL HIGH (ref 65–99)
Potassium: 4.4 mmol/L (ref 3.5–5.2)
Sodium: 144 mmol/L (ref 134–144)
Total Protein: 7.6 g/dL (ref 6.0–8.5)

## 2020-03-10 LAB — LIPID PANEL
Chol/HDL Ratio: 3.7 ratio (ref 0.0–5.0)
Cholesterol, Total: 144 mg/dL (ref 100–199)
HDL: 39 mg/dL — ABNORMAL LOW (ref >39)
LDL Chol Calc (NIH): 84 mg/dL (ref 0–99)
Triglycerides: 118 mg/dL (ref 0–149)
VLDL Cholesterol Cal: 21 mg/dL (ref 5–40)

## 2020-03-10 LAB — CBC WITH DIFFERENTIAL/PLATELET
Basophils Absolute: 0.1 10*3/uL (ref 0.0–0.2)
Basos: 1 %
EOS (ABSOLUTE): 0.1 10*3/uL (ref 0.0–0.4)
Eos: 2 %
Hematocrit: 47 % (ref 37.5–51.0)
Hemoglobin: 15.6 g/dL (ref 13.0–17.7)
Immature Grans (Abs): 0 10*3/uL (ref 0.0–0.1)
Immature Granulocytes: 0 %
Lymphocytes Absolute: 4.3 10*3/uL — ABNORMAL HIGH (ref 0.7–3.1)
Lymphs: 60 %
MCH: 29.8 pg (ref 26.6–33.0)
MCHC: 33.2 g/dL (ref 31.5–35.7)
MCV: 90 fL (ref 79–97)
Monocytes Absolute: 0.5 10*3/uL (ref 0.1–0.9)
Monocytes: 7 %
Neutrophils Absolute: 2.2 10*3/uL (ref 1.4–7.0)
Neutrophils: 30 %
Platelets: 196 10*3/uL (ref 150–450)
RBC: 5.24 x10E6/uL (ref 4.14–5.80)
RDW: 13.4 % (ref 11.6–15.4)
WBC: 7.2 10*3/uL (ref 3.4–10.8)

## 2020-03-10 LAB — HCV INTERPRETATION

## 2020-03-10 LAB — HCV AB W/RFLX TO VERIFICATION: HCV Ab: <0.1 s/co ratio (ref 0.0–0.9)

## 2020-08-12 ENCOUNTER — Telehealth: Payer: Self-pay | Admitting: Physician Assistant

## 2020-08-12 ENCOUNTER — Other Ambulatory Visit: Payer: Self-pay

## 2020-08-17 ENCOUNTER — Telehealth: Payer: Self-pay | Admitting: Physician Assistant

## 2020-08-17 ENCOUNTER — Other Ambulatory Visit: Payer: Self-pay

## 2021-03-10 ENCOUNTER — Ambulatory Visit: Payer: 59 | Admitting: Family

## 2021-03-31 ENCOUNTER — Encounter: Payer: 59 | Admitting: Family Medicine

## 2021-03-31 ENCOUNTER — Other Ambulatory Visit: Payer: Self-pay

## 2021-04-01 ENCOUNTER — Ambulatory Visit (INDEPENDENT_AMBULATORY_CARE_PROVIDER_SITE_OTHER): Payer: 59 | Admitting: Family Medicine

## 2021-04-01 ENCOUNTER — Encounter: Payer: Self-pay | Admitting: Family Medicine

## 2021-04-01 ENCOUNTER — Other Ambulatory Visit: Payer: Self-pay

## 2021-04-01 VITALS — BP 126/87 | HR 81 | Temp 97.5°F | Resp 16 | Ht 73.25 in | Wt 281.2 lb

## 2021-04-01 DIAGNOSIS — Z0000 Encounter for general adult medical examination without abnormal findings: Secondary | ICD-10-CM | POA: Diagnosis not present

## 2021-04-01 NOTE — Progress Notes (Signed)
Established Patient Office Visit  Subjective:  Patient ID: Justin Serrano, male    DOB: [birth year]  Age: 40 y.o. MRN: [id number]  CC:  Chief Complaint  Patient presents with   Annual Exam    HPI Justin Serrano presents for routine annual exam. Patient denies acute complaints.   Past Medical History:  Diagnosis Date   Closed left ankle fracture     Past Surgical History:  Procedure Laterality Date   TONSILLECTOMY      Family History  Problem Relation Age of Onset   Healthy Mother    Healthy Father    Healthy Sister    Cancer Maternal Grandfather    Heart disease Neg Hx    Heart attack Neg Hx    Stroke Neg Hx     Social History   Socioeconomic History   Marital status: Married    Spouse name: Not on file   Number of children: Not on file   Years of education: Not on file   Highest education level: Not on file  Occupational History   Not on file  Tobacco Use   Smoking status: Never   Smokeless tobacco: Never  Substance and Sexual Activity   Alcohol use: Yes    Comment: occasionally   Drug use: Never   Sexual activity: Not on file  Other Topics Concern   Not on file  Social History Narrative   Not on file   Social Determinants of Health   Financial Resource Strain: Not on file  Food Insecurity: Not on file  Transportation Needs: Not on file  Physical Activity: Not on file  Stress: Not on file  Social Connections: Not on file  Intimate Partner Violence: Not on file    No outpatient medications prior to visit.   No facility-administered medications prior to visit.    Allergies  Allergen Reactions   Ampicillin     ROS Review of Systems  All other systems reviewed and are negative.    Objective:    Physical Exam Vitals and nursing note reviewed.  Constitutional:      General: He is not in acute distress.    Appearance: He is obese.  HENT:     Head: Normocephalic and atraumatic.     Right Ear: Tympanic membrane, ear canal and  external ear normal.     Left Ear: Tympanic membrane, ear canal and external ear normal.     Mouth/Throat:     Mouth: Mucous membranes are dry.     Pharynx: Oropharynx is clear.  Eyes:     Conjunctiva/sclera: Conjunctivae normal.     Pupils: Pupils are equal, round, and reactive to light.  Neck:     Thyroid: No thyromegaly.  Cardiovascular:     Rate and Rhythm: Normal rate and regular rhythm.     Heart sounds: Normal heart sounds. No murmur heard. Pulmonary:     Effort: Pulmonary effort is normal.     Breath sounds: Normal breath sounds.  Abdominal:     General: There is no distension.     Palpations: Abdomen is soft.     Tenderness: There is no abdominal tenderness.     Hernia: There is no hernia in the left inguinal area or right inguinal area.  Genitourinary:    Penis: Normal and circumcised.      Testes: Normal.  Musculoskeletal:     Cervical back: Normal range of motion and neck supple.     Right lower leg: No edema.  Left lower leg: No edema.  Skin:    General: Skin is warm and dry.  Neurological:     General: No focal deficit present.     Mental Status: He is alert and oriented to person, place, and time.  Psychiatric:        Mood and Affect: Mood normal.        Behavior: Behavior normal.    BP 126/87 (BP Location: Right Arm, Patient Position: Sitting, Cuff Size: Large)   Pulse 81   Temp (!) 97.5 F (36.4 C) (Temporal)   Resp 16   Ht 6' 1.25" (1.861 m)   Wt 281 lb 3.2 oz (127.6 kg)   SpO2 97%   BMI 36.85 kg/m  Wt Readings from Last 3 Encounters:  04/01/21 281 lb 3.2 oz (127.6 kg)  03/09/20 288 lb (130.6 kg)  02/07/19 276 lb 12.8 oz (125.6 kg)     Health Maintenance Due  Topic Date Due   TETANUS/TDAP  Never done   COVID-19 Vaccine (3 - Booster for Pfizer series) 03/26/2020   INFLUENZA VACCINE  02/28/2021    There are no preventive care reminders to display for this patient.      Assessment & Plan:  1. Visit for well man health  check Unremarkable exam. Routine labs ordered - CMP14+EGFR - CBC with Differential - Lipid Panel   Follow-up: Return in about 1 year (around 04/01/2022) for physical.    Becky Sax, MD

## 2021-04-01 NOTE — Progress Notes (Signed)
Patient is here for his annual . Patient would like his PSA checked and colon screening.

## 2021-04-02 LAB — CBC WITH DIFFERENTIAL/PLATELET
Basophils Absolute: 0.1 10*3/uL (ref 0.0–0.2)
Basos: 1 %
EOS (ABSOLUTE): 0.1 10*3/uL (ref 0.0–0.4)
Eos: 2 %
Hematocrit: 44.2 % (ref 37.5–51.0)
Hemoglobin: 14.5 g/dL (ref 13.0–17.7)
Immature Grans (Abs): 0 10*3/uL (ref 0.0–0.1)
Immature Granulocytes: 0 %
Lymphocytes Absolute: 3 10*3/uL (ref 0.7–3.1)
Lymphs: 57 %
MCH: 29.7 pg (ref 26.6–33.0)
MCHC: 32.8 g/dL (ref 31.5–35.7)
MCV: 90 fL (ref 79–97)
Monocytes Absolute: 0.4 10*3/uL (ref 0.1–0.9)
Monocytes: 7 %
Neutrophils Absolute: 1.7 10*3/uL (ref 1.4–7.0)
Neutrophils: 33 %
Platelets: 205 10*3/uL (ref 150–450)
RBC: 4.89 x10E6/uL (ref 4.14–5.80)
RDW: 13.7 % (ref 11.6–15.4)
WBC: 5.2 10*3/uL (ref 3.4–10.8)

## 2021-04-02 LAB — LIPID PANEL
Chol/HDL Ratio: 3.6 ratio (ref 0.0–5.0)
Cholesterol, Total: 128 mg/dL (ref 100–199)
HDL: 36 mg/dL — ABNORMAL LOW (ref >39)
LDL Chol Calc (NIH): 74 mg/dL (ref 0–99)
Triglycerides: 93 mg/dL (ref 0–149)
VLDL Cholesterol Cal: 18 mg/dL (ref 5–40)

## 2021-04-02 LAB — CMP14+EGFR
ALT: 22 IU/L (ref 0–44)
AST: 34 IU/L (ref 0–40)
Albumin/Globulin Ratio: 1.5 (ref 1.2–2.2)
Albumin: 4.4 g/dL (ref 4.0–5.0)
Alkaline Phosphatase: 50 IU/L (ref 44–121)
BUN/Creatinine Ratio: 12 (ref 9–20)
BUN: 9 mg/dL (ref 6–20)
Bilirubin Total: 0.6 mg/dL (ref 0.0–1.2)
CO2: 26 mmol/L (ref 20–29)
Calcium: 9.8 mg/dL (ref 8.7–10.2)
Chloride: 103 mmol/L (ref 96–106)
Creatinine, Ser: 0.78 mg/dL (ref 0.76–1.27)
Globulin, Total: 2.9 g/dL (ref 1.5–4.5)
Glucose: 88 mg/dL (ref 65–99)
Potassium: 5 mmol/L (ref 3.5–5.2)
Sodium: 143 mmol/L (ref 134–144)
Total Protein: 7.3 g/dL (ref 6.0–8.5)
eGFR: 116 mL/min/{1.73_m2} (ref >59)

## 2021-08-24 ENCOUNTER — Other Ambulatory Visit: Payer: Self-pay

## 2021-08-24 ENCOUNTER — Emergency Department (HOSPITAL_COMMUNITY): Payer: 59

## 2021-08-24 ENCOUNTER — Emergency Department (HOSPITAL_COMMUNITY)
Admission: EM | Admit: 2021-08-24 | Discharge: 2021-08-24 | Disposition: A | Discharge status: Home / Self Care | Payer: 59 | Attending: Emergency Medicine | Admitting: Emergency Medicine

## 2021-08-24 DIAGNOSIS — R519 Headache, unspecified: Secondary | ICD-10-CM | POA: Insufficient documentation

## 2021-08-24 DIAGNOSIS — R0789 Other chest pain: Secondary | ICD-10-CM | POA: Insufficient documentation

## 2021-08-24 DIAGNOSIS — Z20822 Contact with and (suspected) exposure to covid-19: Secondary | ICD-10-CM | POA: Diagnosis not present

## 2021-08-24 LAB — CBC
HCT: 47.2 % (ref 39.0–52.0)
Hemoglobin: 15.3 g/dL (ref 13.0–17.0)
MCH: 29 pg (ref 26.0–34.0)
MCHC: 32.4 g/dL (ref 30.0–36.0)
MCV: 89.4 fL (ref 80.0–100.0)
Platelets: 206 10*3/uL (ref 150–400)
RBC: 5.28 MIL/uL (ref 4.22–5.81)
RDW: 13.2 % (ref 11.5–15.5)
WBC: 5.2 10*3/uL (ref 4.0–10.5)
nRBC: 0 % (ref 0.0–0.2)

## 2021-08-24 LAB — BASIC METABOLIC PANEL
Anion gap: 8 (ref 5–15)
BUN: 10 mg/dL (ref 6–20)
CO2: 26 mmol/L (ref 22–32)
Calcium: 9.2 mg/dL (ref 8.9–10.3)
Chloride: 106 mmol/L (ref 98–111)
Creatinine, Ser: 0.86 mg/dL (ref 0.61–1.24)
GFR, Estimated: >60 mL/min (ref >60)
Glucose, Bld: 103 mg/dL — ABNORMAL HIGH (ref 70–99)
Potassium: 4.6 mmol/L (ref 3.5–5.1)
Sodium: 140 mmol/L (ref 135–145)

## 2021-08-24 LAB — RESP PANEL BY RT-PCR (FLU A&B, COVID) ARPGX2
Influenza A by PCR: NEGATIVE
Influenza B by PCR: NEGATIVE
SARS Coronavirus 2 by RT PCR: NEGATIVE

## 2021-08-24 LAB — TROPONIN I (HIGH SENSITIVITY): Troponin I (High Sensitivity): 5 ng/L (ref <18)

## 2021-08-24 MED ORDER — ALUM & MAG HYDROXIDE-SIMETH 200-200-20 MG/5ML PO SUSP
30.0000 mL | Freq: Once | ORAL | Status: AC
  Administered 2021-08-24: 11:00:00 30 mL via ORAL
  Filled 2021-08-24: qty 30

## 2021-08-24 MED ORDER — SODIUM CHLORIDE 0.9 % IV BOLUS
1000.0000 mL | Freq: Once | INTRAVENOUS | Status: AC
  Administered 2021-08-24: 11:00:00 1000 mL via INTRAVENOUS

## 2021-08-24 MED ORDER — LIDOCAINE 5 % EX PTCH
1.0000 | MEDICATED_PATCH | CUTANEOUS | Status: DC
  Administered 2021-08-24: 11:00:00 1 via TRANSDERMAL
  Filled 2021-08-24: qty 1

## 2021-08-24 MED ORDER — FAMOTIDINE IN NACL 20-0.9 MG/50ML-% IV SOLN
20.0000 mg | Freq: Once | INTRAVENOUS | Status: AC
  Administered 2021-08-24: 11:00:00 20 mg via INTRAVENOUS
  Filled 2021-08-24: qty 50

## 2021-08-24 MED ORDER — LIDOCAINE VISCOUS HCL 2 % MT SOLN
15.0000 mL | Freq: Once | OROMUCOSAL | Status: AC
Start: 2021-08-24 — End: 2021-08-24
  Administered 2021-08-24: 11:00:00 15 mL via ORAL
  Filled 2021-08-24: qty 15

## 2021-08-24 MED ORDER — SUCRALFATE 1 G PO TABS: 1.0000 g | ORAL_TABLET | Freq: Three times a day (TID) | ORAL | 0 refills | Status: DC

## 2021-08-24 MED ORDER — METOCLOPRAMIDE HCL 5 MG/ML IJ SOLN
5.0000 mg | Freq: Once | INTRAMUSCULAR | Status: AC
Start: 2021-08-24 — End: 2021-08-24
  Administered 2021-08-24: 11:00:00 5 mg via INTRAVENOUS
  Filled 2021-08-24: qty 2

## 2021-08-24 MED ORDER — DIPHENHYDRAMINE HCL 50 MG/ML IJ SOLN
25.0000 mg | Freq: Once | INTRAMUSCULAR | Status: AC
  Administered 2021-08-24: 11:00:00 25 mg via INTRAVENOUS
  Filled 2021-08-24: qty 1

## 2021-08-24 MED ORDER — KETOROLAC TROMETHAMINE 15 MG/ML IJ SOLN
15.0000 mg | Freq: Once | INTRAMUSCULAR | Status: AC
  Administered 2021-08-24: 11:00:00 15 mg via INTRAVENOUS
  Filled 2021-08-24: qty 1

## 2021-08-24 MED ORDER — MAGNESIUM SULFATE 2 GM/50ML IV SOLN
2.0000 g | Freq: Once | INTRAVENOUS | Status: AC
  Administered 2021-08-24: 11:00:00 2 g via INTRAVENOUS
  Filled 2021-08-24: qty 50

## 2021-08-24 NOTE — Discharge Instructions (Addendum)
Return to the Emergency Department if you have unusual chest pain, pressure, or discomfort, shortness of breath, nausea, vomiting, burping, heartburn, tingling upper body parts, sweating, cold, clammy skin, or racing heartbeat. Call 911 if you think you are having a heart attack. Take all cardiac medications as prescribed - notify your doctor if you have any side effects. Follow cardiac diet - avoid fatty & fried foods, don't eat too much red meat, eat lots of fruits & vegetables, and dairy products should be low fat. Please lose weight if you are overweight. Become more active with walking, gardening, or any other activity that gets you to moving.   Please return to the emergency department immediately for any new or concerning symptoms, or if you get worse.   Please review your mychart regarding COVID19 status, return to ED if worse or if you develop difficulty breathing. Worsening or worrisome symptoms.

## 2021-08-24 NOTE — ED Provider Notes (Signed)
[facility]   CSN: [id number] Arrival date & time: 08/24/21  [id number]     History  Chief Complaint  Patient presents with   Chest Pain   Headache    Justin Serrano is a 41 y.o. male.  This is a 41 y.o. male without significant medical history as below who presents to the ED with complaint of chest pain, headache.  Patient reports onset of chest pain 2 weeks ago after exercising.  Pain resolved spontaneously.  Lasting approximate 3 to 4 minutes.  Localized to the left chest wall, reproducible on palpation, described as a pulling, tightness sensation.  Resolved spontaneously.  Patient identifies the chest pain returned a few days ago, again resolved spontaneously.  More dull than previously felt.  Pain resumed again this morning while at rest. Improved since the onset, rated 2/10 at this time. Also a/w ha to posterior portion of head since this AM.  Also rated 2/10 in terms of pain.  Described as a pressure, throbbing sensation.  Not associate vision changes, numbness, tingling, gait disturbance, nausea or vomiting.  No fevers or chills.  No IV drug use.  No history of trauma or spinal surgery.  No recent travel or sick contacts.  Patient is report recent increase to his physical activity recently that he feels is the etiology of his chest complaints.  Possibly associated with acid reflux.  No medications prior to arrival.  He is non-smoker.  No history of CAD.  No hypertension or diabetes diagnosis.    Past Medical History: No date: Closed left ankle fracture  Past Surgical History: No date: TONSILLECTOMY    The history is provided by the patient. No language interpreter was used.  Chest Pain Associated symptoms: headache   Associated symptoms: no abdominal pain, no cough, no dysphagia, no fever, no nausea, no palpitations, no shortness of breath and no vomiting   Headache Associated symptoms: no abdominal pain, no cough, no fever,  no nausea, no photophobia and no vomiting       Home Medications Prior to Admission medications   Medication Sig Start Date End Date Taking? Authorizing Provider  sucralfate (CARAFATE) 1 g tablet Take 1 tablet (1 g total) by mouth 4 (four) times daily -  with meals and at bedtime for 7 days. 08/24/21 08/31/21 Yes Tanda RockersGray, Taylah Dubiel A, DO      Allergies    Ampicillin    Review of Systems   Review of Systems  Constitutional:  Negative for chills and fever.  HENT:  Negative for facial swelling and trouble swallowing.   Eyes:  Negative for photophobia and visual disturbance.  Respiratory:  Negative for cough and shortness of breath.   Cardiovascular:  Positive for chest pain. Negative for palpitations.  Gastrointestinal:  Negative for abdominal pain, nausea and vomiting.  Endocrine: Negative for polydipsia and polyuria.  Genitourinary:  Negative for difficulty urinating and hematuria.  Musculoskeletal:  Negative for gait problem and joint swelling.  Skin:  Negative for pallor and rash.  Neurological:  Positive for headaches. Negative for syncope.  Psychiatric/Behavioral:  Negative for agitation and confusion.    Physical Exam Updated Vital Signs BP 114/79    Pulse 70    Temp 99.5 F (37.5 C) (Oral)    Resp 19    Ht 6\' 1"  (1.854 m)    Wt 127.2 kg    SpO2 99%    BMI 36.99 kg/m  Physical Exam Vitals and nursing note reviewed.  Constitutional:      General: He is not in acute distress.    Appearance: He is well-developed. He is not ill-appearing, toxic-appearing or diaphoretic.  HENT:     Head: Normocephalic and atraumatic.     Right Ear: External ear normal.     Left Ear: External ear normal.     Mouth/Throat:     Mouth: Mucous membranes are moist.  Eyes:     General: No visual field deficit or scleral icterus.    Extraocular Movements: Extraocular movements intact.     Pupils: Pupils are equal, round, and reactive to light.  Cardiovascular:     Rate and Rhythm: Normal rate and  regular rhythm.     Pulses: Normal pulses.          Radial pulses are 2+ on the right side and 2+ on the left side.       Dorsalis pedis pulses are 2+ on the right side and 2+ on the left side.     Heart sounds: Normal heart sounds. No murmur heard. Pulmonary:     Effort: Pulmonary effort is normal. No respiratory distress.     Breath sounds: Normal breath sounds.  Chest:       Comments: Mild tenderness on palpation.  No crepitus, no rash.  No evidence of trauma externally Abdominal:     General: Abdomen is flat.     Palpations: Abdomen is soft.     Tenderness: There is no abdominal tenderness.  Musculoskeletal:        General: Normal range of motion.     Cervical back: Full passive range of motion without pain and normal range of motion.     Right lower leg: No edema.     Left lower leg: No edema.  Skin:    General: Skin is warm and dry.     Capillary Refill: Capillary refill takes less than 2 seconds.  Neurological:     Mental Status: He is alert and oriented to person, place, and time.     GCS: GCS eye subscore is 4. GCS verbal subscore is 5. GCS motor subscore is 6.     Cranial Nerves: Cranial nerves 2-12 are intact. No dysarthria or facial asymmetry.     Sensory: Sensation is intact.     Motor: Motor function is intact. No tremor.     Coordination: Coordination is intact. Finger-Nose-Finger Test normal.     Gait: Gait is intact. Gait normal.     Comments: Strength 5/5 equal and symmetric in all 4 extremities  Psychiatric:        Mood and Affect: Mood normal.        Behavior: Behavior normal.    ED Results / Procedures / Treatments   Labs (all labs ordered are listed, but only abnormal results are displayed) Labs Reviewed  BASIC METABOLIC PANEL - Abnormal; Notable for the following components:      Result Value   Glucose, Bld 103 (*)    All other components within normal limits  RESP PANEL BY RT-PCR (FLU A&B, COVID) ARPGX2  CBC  TROPONIN I (HIGH SENSITIVITY)   TROPONIN I (HIGH SENSITIVITY)    EKG EKG Interpretation  Date/Time:  Wednesday August 24 2021 08:32:32 EST Ventricular Rate:  89 PR Interval:  146 QRS Duration: 92 QT Interval:  350 QTC Calculation: 425 R Axis:   31 Text Interpretation: Normal sinus rhythm Abnormal ECG When compared with ECG of 03-Dec-2018 13:02, PREVIOUS ECG IS PRESENT t- wave abnormalities  seen in prior tracings similar to prior no stemi Confirmed by Tanda Rockers ([id number]) on 08/24/2021 10:07:14 AM  Radiology DG Chest 2 View  Result Date: 08/24/2021 CLINICAL DATA:  Chest pain 4 days. EXAM: CHEST - 2 VIEW COMPARISON:  12/03/2018 FINDINGS: The cardiac silhouette, mediastinal and hilar contours are within normal limits and stable. Minimal streaky bibasilar atelectasis but no infiltrates, edema or effusions. No pneumothorax. The bony thorax is intact. IMPRESSION: Minimal streaky bibasilar atelectasis. No infiltrates, edema or effusions. Electronically Signed   By: Rudie Meyer M.D.   On: 08/24/2021 09:06    Procedures Procedures    Medications Ordered in ED Medications  magnesium sulfate IVPB 2 g 50 mL (2 g Intravenous New Bag/Given 08/24/21 1113)  lidocaine (LIDODERM) 5 % 1 patch (1 patch Transdermal Patch Applied 08/24/21 1110)  sodium chloride 0.9 % bolus 1,000 mL (1,000 mLs Intravenous New Bag/Given 08/24/21 1031)  metoCLOPramide (REGLAN) injection 5 mg (5 mg Intravenous Given 08/24/21 1033)  diphenhydrAMINE (BENADRYL) injection 25 mg (25 mg Intravenous Given 08/24/21 1031)  famotidine (PEPCID) IVPB 20 mg premix (0 mg Intravenous Stopped 08/24/21 1109)  alum & mag hydroxide-simeth (MAALOX/MYLANTA) 200-200-20 MG/5ML suspension 30 mL (30 mLs Oral Given 08/24/21 1035)    And  lidocaine (XYLOCAINE) 2 % viscous mouth solution 15 mL (15 mLs Oral Given 08/24/21 1035)  ketorolac (TORADOL) 15 MG/ML injection 15 mg (15 mg Intravenous Given 08/24/21 1109)    ED Course/ Medical Decision Making/ A&P                            Medical Decision Making Amount and/or Complexity of Data Reviewed Labs: ordered. Radiology: ordered.  Risk OTC drugs. Prescription drug management.    CC: Chest pain, headache  This patient presents to the Emergency Department for the above complaint. This involves an extensive number of treatment options and is a complaint that carries with it a high risk of complications and morbidity. Vital signs were reviewed. Serious etiologies considered.  Record review:  Previous records obtained and reviewed   Medical and surgical history as noted above.   Work up as above, notable for:  Labs & imaging results that were available during my care of the patient were reviewed by me and considered in my medical decision making.   I ordered imaging studies which included chest x-ray and I independently visualized and interpreted imaging which showed no acute process  Cardiac monitoring reviewed and interpreted personally which shows normal sinus rhythm  Social determinants of health include - N/a   Management: Patient given IV fluids, GI cocktail, migraine cocktail.  Reassessment:  Patient ports symptoms resolved.  He is ambulatory, tolerant p.o. intake.  Repeat neurologic exam is nonfocal.  Speaking clearly in full sentences.  Discussed lab results and imaging results.  Pending COVID-19 test, he will follow-up on MyChart.  No respiratory complaints.  He has appointment with PCP coming up and will follow-up at that time.  Discussed supportive care for home.  The patient's chest pain is not suggestive of pulmonary embolus, cardiac ischemia, aortic dissection, pericarditis, myocarditis, pulmonary embolism, pneumothorax, pneumonia, Zoster, or esophageal perforation, or other serious etiology.  Historically not abrupt in onset, tearing or ripping, pulses symmetric. EKG nonspecific for ischemia/infarction. No dysrhythmias, brugada, WPW, prolonged QT noted. Troponin negative. CXR reviewed. Labs  without demonstration of acute pathology unless otherwise noted above. Low HEAR Score. Given the extremely low risk of these diagnoses further testing and evaluation for  these possibilities does not appear to be indicated at this time. Patient in no distress and overall condition improved here in the ED. Detailed discussions were had with the patient regarding current findings, and need for close f/u with PCP or on call doctor. The patient has been instructed to return immediately if the symptoms worsen in any way for re-evaluation. Patient verbalized understanding and is in agreement with current care plan. All questions answered prior to discharge.   This chart was dictated using voice recognition software.  Despite best efforts to proofread,  errors can occur which can change the documentation meaning.         Final Clinical Impression(s) / ED Diagnoses Final diagnoses:  Atypical chest pain  Nonintractable headache, unspecified chronicity pattern, unspecified headache type    Rx / DC Orders ED Discharge Orders          Ordered    sucralfate (CARAFATE) 1 g tablet  3 times daily with meals & bedtime        08/24/21 1153              Jeanell Sparrow, DO 08/24/21 1155

## 2021-08-24 NOTE — ED Triage Notes (Signed)
Pt. Stated, I started having chest pain on jan 14 and then came back , Justin Serrano also Have had a headache off and on since Saturday.

## 2021-08-29 ENCOUNTER — Other Ambulatory Visit: Payer: Self-pay

## 2021-08-29 ENCOUNTER — Ambulatory Visit: Payer: 59 | Admitting: Family Medicine

## 2021-08-29 VITALS — BP 124/85 | HR 84 | Temp 98.3°F | Resp 16 | Wt 287.0 lb

## 2021-08-29 DIAGNOSIS — R0789 Other chest pain: Secondary | ICD-10-CM | POA: Diagnosis not present

## 2021-08-29 NOTE — Progress Notes (Signed)
Patient was having chest pain with pulling for a couple week. Patient has been to the ER to get check out but everything came out ok.Patient was told that it is possible muscle pain from working out Patient is here for a follow-up

## 2021-08-31 ENCOUNTER — Encounter: Payer: Self-pay | Admitting: Family Medicine

## 2021-08-31 NOTE — Progress Notes (Signed)
Established Patient Office Visit  Subjective:  Patient ID: Justin Serrano, male    DOB: 1981-04-22  Age: 41 y.o. MRN: [id number]  CC:  Chief Complaint  Patient presents with   Follow-up    ER visit    HPI Justin Serrano presents for follow up of ED visit where he was experiencing chest pain. Cardiac w/u was neg and he was diagnosed with musculoskeletal pain. He denies known trauma or injury. Pain has since resolved with no recurrence.   Past Medical History:  Diagnosis Date   Closed left ankle fracture     Past Surgical History:  Procedure Laterality Date   TONSILLECTOMY      Family History  Problem Relation Age of Onset   Healthy Mother    Healthy Father    Healthy Sister    Cancer Maternal Grandfather    Heart disease Neg Hx    Heart attack Neg Hx    Stroke Neg Hx     Social History   Socioeconomic History   Marital status: Married    Spouse name: Not on file   Number of children: Not on file   Years of education: Not on file   Highest education level: Not on file  Occupational History   Not on file  Tobacco Use   Smoking status: Never   Smokeless tobacco: Never  Substance and Sexual Activity   Alcohol use: Yes    Comment: occasionally   Drug use: Never   Sexual activity: Not on file  Other Topics Concern   Not on file  Social History Narrative   Not on file   Social Determinants of Health   Financial Resource Strain: Not on file  Food Insecurity: Not on file  Transportation Needs: Not on file  Physical Activity: Not on file  Stress: Not on file  Social Connections: Not on file  Intimate Partner Violence: Not on file    ROS Review of Systems  Respiratory:  Negative for cough and shortness of breath.   Cardiovascular:  Positive for chest pain. Negative for palpitations.  All other systems reviewed and are negative.  Objective:   Today's Vitals: BP 124/85    Pulse 84    Temp 98.3 F (36.8 C) (Oral)    Resp 16    Wt 287 lb (130.2 kg)     SpO2 98%    BMI 37.87 kg/m   Physical Exam Vitals and nursing note reviewed.  Constitutional:      General: He is not in acute distress. Cardiovascular:     Rate and Rhythm: Normal rate and regular rhythm.  Pulmonary:     Effort: Pulmonary effort is normal.     Breath sounds: Normal breath sounds.  Chest:     Chest wall: No deformity, swelling or tenderness.  Neurological:     General: No focal deficit present.     Mental Status: He is alert and oriented to person, place, and time.    Assessment & Plan:   1. Chest wall pain Most likely of ms etiology. Sx have since resolved. Follow up if sx recur.     Outpatient Encounter Medications as of 08/29/2021  Medication Sig   sucralfate (CARAFATE) 1 g tablet Take 1 tablet (1 g total) by mouth 4 (four) times daily -  with meals and at bedtime for 7 days.   No facility-administered encounter medications on file as of 08/29/2021.    Follow-up: No follow-ups on file.   Georganna Skeans  P, MD

## 2022-01-18 ENCOUNTER — Ambulatory Visit
Admission: RE | Admit: 2022-01-18 | Discharge: 2022-01-18 | Disposition: A | Discharge status: Home / Self Care | Payer: 59 | Source: Ambulatory Visit

## 2022-01-18 ENCOUNTER — Other Ambulatory Visit: Payer: Self-pay

## 2022-01-18 ENCOUNTER — Ambulatory Visit (INDEPENDENT_AMBULATORY_CARE_PROVIDER_SITE_OTHER): Payer: 59

## 2022-01-18 VITALS — BP 139/93 | HR 93 | Temp 98.2°F | Resp 20

## 2022-01-18 DIAGNOSIS — R059 Cough, unspecified: Secondary | ICD-10-CM | POA: Diagnosis not present

## 2022-01-18 DIAGNOSIS — R053 Chronic cough: Secondary | ICD-10-CM

## 2022-01-18 DIAGNOSIS — H9201 Otalgia, right ear: Secondary | ICD-10-CM

## 2022-01-18 MED ORDER — PROMETHAZINE-DM 6.25-15 MG/5ML PO SYRP: 5.0000 mL | ORAL_SOLUTION | Freq: Four times a day (QID) | ORAL | 0 refills | Status: DC | PRN

## 2022-01-18 MED ORDER — PREDNISONE 10 MG (21) PO TBPK: ORAL_TABLET | Freq: Every day | ORAL | 0 refills | Status: DC

## 2022-01-18 NOTE — ED Triage Notes (Addendum)
Cough for 2 1/2 weeks.  Has not seen a provider.  Denies fever.  Random chills initially.  Patient has coughed up phlegm-yellow/green to clear.  Patient has a stuffy right ear

## 2022-01-18 NOTE — ED Provider Notes (Signed)
[name]    CSN: [id number] Arrival date & time: 01/18/22  1739      History   Chief Complaint Chief Complaint  Patient presents with   Cough    Productive cough, not getting better or worse. Congested sinuses. Feels like an ear infection (in right ear), could also be a mild case of walking pneumonia? No fever. No chills. No aches. Been functioning as normal for the last week or so. - Entered by patient   Appointment    6:00    HPI Justin Serrano is a 41 y.o. male.   Patient presents with productive cough and right ear pain.  Denies any associated nasal congestion, runny nose, fever, chest pain, shortness of breath, sore throat, nausea, vomiting, diarrhea, abdominal pain.  Denies history of asthma or COPD and patient is not a smoker.  Patient has taken over-the-counter cold and flu medications with minimal improvement of symptoms.  Cough has been present for about 2.5 weeks.  Right ear pain started about 3 days ago.   Cough   Past Medical History:  Diagnosis Date   Closed left ankle fracture     There are no problems to display for this patient.   Past Surgical History:  Procedure Laterality Date   TONSILLECTOMY         Home Medications    Prior to Admission medications   Medication Sig Start Date End Date Taking? Authorizing Provider  Phenylephrine-APAP-guaiFENesin (TYLENOL SINUS SEVERE PO) Take by mouth.   Yes [provider]  predniSONE (STERAPRED UNI-PAK 21 TAB) 10 MG (21) TBPK tablet Take by mouth daily. Take 6 tabs by mouth daily  for 2 days, then 5 tabs for 2 days, then 4 tabs for 2 days, then 3 tabs for 2 days, 2 tabs for 2 days, then 1 tab by mouth daily for 2 days 01/18/22  Yes Drayton Tieu, [place] E, FNP  promethazine-dextromethorphan (PROMETHAZINE-DM) 6.25-15 MG/5ML syrup Take 5 mLs by mouth 4 (four) times daily as needed for cough. 01/18/22  Yes Nyelah Emmerich, Acie Fredrickson, FNP  sucralfate (CARAFATE) 1 g tablet Take 1 tablet (1 g total) by mouth 4  (four) times daily -  with meals and at bedtime for 7 days. Patient not taking: Reported on 01/18/2022 08/24/21 08/31/21  Sloan Leiter, DO    Family History Family History  Problem Relation Age of Onset   Healthy Mother    Healthy Father    Healthy Sister    Cancer Maternal Grandfather    Heart disease Neg Hx    Heart attack Neg Hx    Stroke Neg Hx     Social History Social History   Tobacco Use   Smoking status: Never   Smokeless tobacco: Never  Vaping Use   Vaping Use: Never used  Substance Use Topics   Alcohol use: Yes    Comment: occasionally   Drug use: Never     Allergies   Ampicillin   Review of Systems Review of Systems Per HPI  Physical Exam Triage Vital Signs ED Triage Vitals  Enc Vitals Group     BP 01/18/22 1836 (!) 139/93     Pulse Rate 01/18/22 1836 93     Resp 01/18/22 1836 20     Temp 01/18/22 1836 98.2 F (36.8 C)     Temp Source 01/18/22 1836 Oral     SpO2 01/18/22 1836 96 %     Weight --      Height --  Head Circumference --      Peak Flow --      Pain Score 01/18/22 1832 0     Pain Loc --      Pain Edu? --      Excl. in [place]? --    No data found.  Updated Vital Signs BP (!) 139/93 (BP Location: Right Arm) Comment (BP Location): large cuff  Pulse 93   Temp 98.2 F (36.8 C) (Oral)   Resp 20   SpO2 96%   Visual Acuity Right Eye Distance:   Left Eye Distance:   Bilateral Distance:    Right Eye Near:   Left Eye Near:    Bilateral Near:     Physical Exam Constitutional:      General: He is not in acute distress.    Appearance: Normal appearance. He is not toxic-appearing or diaphoretic.  HENT:     Head: Normocephalic and atraumatic.     Right Ear: Ear canal normal. No drainage, swelling or tenderness. A middle ear effusion is present. There is no impacted cerumen. No foreign body. No mastoid tenderness. Tympanic membrane is not perforated, erythematous or bulging.     Left Ear: Tympanic membrane and ear canal normal.      Nose: Congestion present.     Mouth/Throat:     Mouth: Mucous membranes are moist.     Pharynx: No posterior oropharyngeal erythema.  Eyes:     Extraocular Movements: Extraocular movements intact.     Conjunctiva/sclera: Conjunctivae normal.     Pupils: Pupils are equal, round, and reactive to light.  Cardiovascular:     Rate and Rhythm: Normal rate and regular rhythm.     Pulses: Normal pulses.     Heart sounds: Normal heart sounds.  Pulmonary:     Effort: Pulmonary effort is normal. No respiratory distress.     Breath sounds: No stridor. Rhonchi present. No wheezing or rales.  Abdominal:     General: Abdomen is flat. Bowel sounds are normal.     Palpations: Abdomen is soft.  Musculoskeletal:        General: Normal range of motion.     Cervical back: Normal range of motion.  Skin:    General: Skin is warm and dry.  Neurological:     General: No focal deficit present.     Mental Status: He is alert and oriented to person, place, and time. Mental status is at baseline.  Psychiatric:        Mood and Affect: Mood normal.        Behavior: Behavior normal.      UC Treatments / Results  Labs (all labs ordered are listed, but only abnormal results are displayed) Labs Reviewed - No data to display  EKG   Radiology DG Chest 2 View  Result Date: 01/18/2022 CLINICAL DATA:  Persistent cough EXAM: CHEST - 2 VIEW COMPARISON:  08/24/2021 FINDINGS: Cardiac and mediastinal contours are within normal limits. No focal pulmonary opacity. No pleural effusion or pneumothorax. No acute osseous abnormality. IMPRESSION: No acute cardiopulmonary process. Electronically Signed   By: Merilyn Baba M.D.   On: 01/18/2022 18:59    Procedures Procedures (including critical care time)  Medications Ordered in UC Medications - No data to display  Initial Impression / Assessment and Plan / UC Course  I have reviewed the triage vital signs and the nursing notes.  Pertinent labs & imaging  results that were available during my care of the patient were reviewed by  me and considered in my medical decision making (see chart for details).     Chest x-ray was negative for any acute cardiopulmonary process.  I am suspicious of possible acute viral bronchitis.  Symptoms do appear viral in nature.  Will treat with prednisone steroid taper as this will help alleviate persistent cough and inflammation in chest as well as right middle ear effusion.  Promethazine DM prescribed to take as needed for cough.  Advised patient this can cause drowsiness.  Discussed strict return precautions.  Patient verbalized understanding and was agreeable with plan. Final Clinical Impressions(s) / UC Diagnoses   Final diagnoses:  Persistent cough  Right ear pain     Discharge Instructions      Your chest x-ray was normal.  I am suspicious of possible bronchitis.  You are being treated with prednisone steroid as well as a cough medication to take as needed.  Please be advised that cough medication can cause drowsiness.  Follow-up if symptoms persist or worsen.     ED Prescriptions     Medication Sig Dispense Auth. Provider   predniSONE (STERAPRED UNI-PAK 21 TAB) 10 MG (21) TBPK tablet Take by mouth daily. Take 6 tabs by mouth daily  for 2 days, then 5 tabs for 2 days, then 4 tabs for 2 days, then 3 tabs for 2 days, 2 tabs for 2 days, then 1 tab by mouth daily for 2 days 42 tablet [place], [place] E, [place]   promethazine-dextromethorphan (PROMETHAZINE-DM) 6.25-15 MG/5ML syrup Take 5 mLs by mouth 4 (four) times daily as needed for cough. 118 mL Gustavus Bryant, [place]      PDMP not reviewed this encounter.   Gustavus Bryant, [place] 01/18/22 [phone]

## 2022-01-18 NOTE — Discharge Instructions (Signed)
Your chest x-ray was normal.  I am suspicious of possible bronchitis.  You are being treated with prednisone steroid as well as a cough medication to take as needed.  Please be advised that cough medication can cause drowsiness.  Follow-up if symptoms persist or worsen.

## 2022-02-15 ENCOUNTER — Ambulatory Visit: Payer: 59 | Admitting: Family Medicine

## 2022-02-15 ENCOUNTER — Encounter: Payer: Self-pay | Admitting: Family Medicine

## 2022-02-15 VITALS — BP 132/91 | HR 87 | Resp 16 | Wt 293.2 lb

## 2022-02-15 DIAGNOSIS — Z6838 Body mass index (BMI) 38.0-38.9, adult: Secondary | ICD-10-CM

## 2022-02-15 DIAGNOSIS — E6609 Other obesity due to excess calories: Secondary | ICD-10-CM

## 2022-02-15 DIAGNOSIS — R030 Elevated blood-pressure reading, without diagnosis of hypertension: Secondary | ICD-10-CM

## 2022-02-15 DIAGNOSIS — M792 Neuralgia and neuritis, unspecified: Secondary | ICD-10-CM

## 2022-02-16 ENCOUNTER — Encounter: Payer: Self-pay | Admitting: Family Medicine

## 2022-02-16 NOTE — Progress Notes (Signed)
Established Patient Office Visit  Subjective    Patient ID: Justin Serrano, male    DOB: 1981/07/17  Age: 41 y.o. MRN: [id number]  CC:  Chief Complaint  Patient presents with   Numbness    HPI Justin Serrano presents for complaint of intermittent numbness in foot that actually has improved/resolved. Patient denies known trauma or injury.    Outpatient Encounter Medications as of 02/15/2022  Medication Sig   [DISCONTINUED] Phenylephrine-APAP-guaiFENesin (TYLENOL SINUS SEVERE PO) Take by mouth.   [DISCONTINUED] predniSONE (STERAPRED UNI-PAK 21 TAB) 10 MG (21) TBPK tablet Take by mouth daily. Take 6 tabs by mouth daily  for 2 days, then 5 tabs for 2 days, then 4 tabs for 2 days, then 3 tabs for 2 days, 2 tabs for 2 days, then 1 tab by mouth daily for 2 days   [DISCONTINUED] promethazine-dextromethorphan (PROMETHAZINE-DM) 6.25-15 MG/5ML syrup Take 5 mLs by mouth 4 (four) times daily as needed for cough.   [DISCONTINUED] sucralfate (CARAFATE) 1 g tablet Take 1 tablet (1 g total) by mouth 4 (four) times daily -  with meals and at bedtime for 7 days. (Patient not taking: Reported on 01/18/2022)   No facility-administered encounter medications on file as of 02/15/2022.    Past Medical History:  Diagnosis Date   Closed left ankle fracture     Past Surgical History:  Procedure Laterality Date   TONSILLECTOMY      Family History  Problem Relation Age of Onset   Healthy Mother    Healthy Father    Healthy Sister    Cancer Maternal Grandfather    Heart disease Neg Hx    Heart attack Neg Hx    Stroke Neg Hx     Social History   Socioeconomic History   Marital status: Married    Spouse name: Not on file   Number of children: Not on file   Years of education: Not on file   Highest education level: Not on file  Occupational History   Not on file  Tobacco Use   Smoking status: Never   Smokeless tobacco: Never  Vaping Use   Vaping Use: Never used  Substance and Sexual  Activity   Alcohol use: Yes    Comment: occasionally   Drug use: Never   Sexual activity: Not on file  Other Topics Concern   Not on file  Social History Narrative   Not on file   Social Determinants of Health   Financial Resource Strain: Not on file  Food Insecurity: Not on file  Transportation Needs: Not on file  Physical Activity: Not on file  Stress: Not on file  Social Connections: Not on file  Intimate Partner Violence: Not on file    Review of Systems  All other systems reviewed and are negative.       Objective    BP (!) 132/91   Pulse 87   Resp 16   Wt 293 lb 3.2 oz (133 kg)   SpO2 96%   BMI 38.68 kg/m   Physical Exam Vitals and nursing note reviewed.  Constitutional:      General: He is not in acute distress. Cardiovascular:     Rate and Rhythm: Normal rate and regular rhythm.  Pulmonary:     Effort: Pulmonary effort is normal.     Breath sounds: Normal breath sounds.  Chest:     Chest wall: No deformity, swelling or tenderness.  Abdominal:     Palpations: Abdomen is  soft.     Tenderness: There is no abdominal tenderness.  Musculoskeletal:     Right lower leg: No edema.     Left lower leg: No edema.  Neurological:     General: No focal deficit present.     Mental Status: He is alert and oriented to person, place, and time.         Assessment & Plan:   1. Neuralgia As is improving, will conservatively observe and monitor  2. Elevated blood-pressure reading without diagnosis of hypertension Patient defers an agent at this time. Will continue to monitor  3. Class 2 obesity due to excess calories without serious comorbidity with body mass index (BMI) of 38.0 to 38.9 in adult Discussed dietary and activity options. monitor    Return in about 2 months (around 04/18/2022) for follow up, physical.   Tommie Raymond, MD

## 2022-03-13 ENCOUNTER — Ambulatory Visit: Payer: 59 | Admitting: Family Medicine

## 2022-04-21 ENCOUNTER — Encounter: Payer: Self-pay | Admitting: Family Medicine

## 2022-04-21 NOTE — Telephone Encounter (Signed)
This is a patient concern prior to appt

## 2022-04-28 ENCOUNTER — Encounter: Payer: Self-pay | Admitting: Family Medicine

## 2022-04-28 ENCOUNTER — Ambulatory Visit (INDEPENDENT_AMBULATORY_CARE_PROVIDER_SITE_OTHER): Payer: 59 | Admitting: Family Medicine

## 2022-04-28 VITALS — BP 135/85 | HR 94 | Temp 98.1°F | Resp 16 | Ht 73.5 in | Wt 290.4 lb

## 2022-04-28 DIAGNOSIS — Z1329 Encounter for screening for other suspected endocrine disorder: Secondary | ICD-10-CM | POA: Diagnosis not present

## 2022-04-28 DIAGNOSIS — Z0000 Encounter for general adult medical examination without abnormal findings: Secondary | ICD-10-CM | POA: Diagnosis not present

## 2022-04-28 DIAGNOSIS — Z13220 Encounter for screening for lipoid disorders: Secondary | ICD-10-CM | POA: Diagnosis not present

## 2022-04-28 DIAGNOSIS — Z130 Encounter for screening for diseases of the blood and blood-forming organs and certain disorders involving the immune mechanism: Secondary | ICD-10-CM

## 2022-04-28 DIAGNOSIS — Z13228 Encounter for screening for other metabolic disorders: Secondary | ICD-10-CM

## 2022-04-28 NOTE — Progress Notes (Signed)
Established Patient Office Visit  Subjective    Patient ID: Justin Serrano, male    DOB: 21-Aug-1980  Age: 41 y.o. MRN: [id number]  CC:  Chief Complaint  Patient presents with   Annual Exam    HPI TESHAUN OLARTE presents for routine annual exam.    Outpatient Encounter Medications as of 04/28/2022  Medication Sig   Hyoscyamine Sulfate SL (LEVSIN/SL) 0.125 MG SUBL place 1 tablet under tongue by translingual route QID PRN abdominal pain and spasm Sublingual 4   No facility-administered encounter medications on file as of 04/28/2022.    Past Medical History:  Diagnosis Date   Closed left ankle fracture     Past Surgical History:  Procedure Laterality Date   TONSILLECTOMY      Family History  Problem Relation Age of Onset   Healthy Mother    Stroke Mother    Healthy Father    Healthy Sister    Cancer Maternal Grandfather    Heart disease Neg Hx    Heart attack Neg Hx     Social History   Socioeconomic History   Marital status: Married    Spouse name: Not on file   Number of children: Not on file   Years of education: Not on file   Highest education level: Not on file  Occupational History   Not on file  Tobacco Use   Smoking status: Never   Smokeless tobacco: Never  Vaping Use   Vaping Use: Never used  Substance and Sexual Activity   Alcohol use: Yes    Alcohol/week: 2.0 standard drinks of alcohol    Types: 2 Cans of beer per week    Comment: occasionally   Drug use: Never   Sexual activity: Yes    Birth control/protection: Condom  Other Topics Concern   Not on file  Social History Narrative   Not on file   Social Determinants of Health   Financial Resource Strain: Not on file  Food Insecurity: Not on file  Transportation Needs: Not on file  Physical Activity: Not on file  Stress: Not on file  Social Connections: Not on file  Intimate Partner Violence: Not on file    Review of Systems  All other systems reviewed and are negative.        Objective    BP 135/85   Pulse 94   Temp 98.1 F (36.7 C) (Oral)   Resp 16   Ht 6' 1.5" (1.867 m)   Wt 290 lb 6.4 oz (131.7 kg)   SpO2 96%   BMI 37.79 kg/m   Physical Exam Vitals and nursing note reviewed.  Constitutional:      General: He is not in acute distress. HENT:     Head: Normocephalic and atraumatic.     Right Ear: Tympanic membrane, ear canal and external ear normal.     Left Ear: Tympanic membrane, ear canal and external ear normal.     Nose: Nose normal.     Mouth/Throat:     Mouth: Mucous membranes are moist.     Pharynx: Oropharynx is clear.  Eyes:     Conjunctiva/sclera: Conjunctivae normal.     Pupils: Pupils are equal, round, and reactive to light.  Neck:     Thyroid: No thyromegaly.  Cardiovascular:     Rate and Rhythm: Normal rate and regular rhythm.     Heart sounds: Normal heart sounds. No murmur heard. Pulmonary:     Effort: Pulmonary effort is normal.  Breath sounds: Normal breath sounds.  Abdominal:     General: There is no distension.     Palpations: Abdomen is soft. There is no mass.     Tenderness: There is no abdominal tenderness.     Hernia: There is no hernia in the left inguinal area or right inguinal area.  Genitourinary:    Penis: Normal and circumcised.      Testes: Normal.  Musculoskeletal:        General: Normal range of motion.     Cervical back: Normal range of motion and neck supple.     Right lower leg: No edema.     Left lower leg: No edema.  Skin:    General: Skin is warm and dry.  Neurological:     General: No focal deficit present.     Mental Status: He is alert and oriented to person, place, and time. Mental status is at baseline.  Psychiatric:        Mood and Affect: Mood normal.        Behavior: Behavior normal.         Assessment & Plan:   1. Annual physical exam  - CMP14+EGFR  2. Screening for deficiency anemia  - CBC with Differential  3. Screening for lipid disorders  - Lipid  Panel  4. Screening for endocrine/metabolic/immunity disorders  - Hemoglobin A1c - TSH    Return in about 1 year (around 04/29/2023) for physical.   Becky Sax, MD

## 2022-04-29 LAB — CMP14+EGFR
ALT: 28 IU/L (ref 0–44)
AST: 40 IU/L (ref 0–40)
Albumin/Globulin Ratio: 1.9 (ref 1.2–2.2)
Albumin: 4.7 g/dL (ref 4.1–5.1)
Alkaline Phosphatase: 49 IU/L (ref 44–121)
BUN/Creatinine Ratio: 13 (ref 9–20)
BUN: 10 mg/dL (ref 6–24)
Bilirubin Total: 0.7 mg/dL (ref 0.0–1.2)
CO2: 20 mmol/L (ref 20–29)
Calcium: 9.7 mg/dL (ref 8.7–10.2)
Chloride: 103 mmol/L (ref 96–106)
Creatinine, Ser: 0.8 mg/dL (ref 0.76–1.27)
Globulin, Total: 2.5 g/dL (ref 1.5–4.5)
Glucose: 88 mg/dL (ref 70–99)
Potassium: 4.4 mmol/L (ref 3.5–5.2)
Sodium: 140 mmol/L (ref 134–144)
Total Protein: 7.2 g/dL (ref 6.0–8.5)
eGFR: 115 mL/min/{1.73_m2} (ref >59)

## 2022-04-29 LAB — LIPID PANEL
Chol/HDL Ratio: 3.6 ratio (ref 0.0–5.0)
Cholesterol, Total: 142 mg/dL (ref 100–199)
HDL: 39 mg/dL — ABNORMAL LOW (ref >39)
LDL Chol Calc (NIH): 87 mg/dL (ref 0–99)
Triglycerides: 84 mg/dL (ref 0–149)
VLDL Cholesterol Cal: 16 mg/dL (ref 5–40)

## 2022-04-29 LAB — CBC WITH DIFFERENTIAL/PLATELET
Basophils Absolute: 0 10*3/uL (ref 0.0–0.2)
Basos: 1 %
EOS (ABSOLUTE): 0.1 10*3/uL (ref 0.0–0.4)
Eos: 2 %
Hematocrit: 46.9 % (ref 37.5–51.0)
Hemoglobin: 15.6 g/dL (ref 13.0–17.7)
Immature Grans (Abs): 0 10*3/uL (ref 0.0–0.1)
Immature Granulocytes: 0 %
Lymphocytes Absolute: 2.6 10*3/uL (ref 0.7–3.1)
Lymphs: 51 %
MCH: 29.6 pg (ref 26.6–33.0)
MCHC: 33.3 g/dL (ref 31.5–35.7)
MCV: 89 fL (ref 79–97)
Monocytes Absolute: 0.3 10*3/uL (ref 0.1–0.9)
Monocytes: 6 %
Neutrophils Absolute: 2 10*3/uL (ref 1.4–7.0)
Neutrophils: 40 %
Platelets: 206 10*3/uL (ref 150–450)
RBC: 5.27 x10E6/uL (ref 4.14–5.80)
RDW: 13.6 % (ref 11.6–15.4)
WBC: 5.1 10*3/uL (ref 3.4–10.8)

## 2022-04-29 LAB — HEMOGLOBIN A1C
Est. average glucose Bld gHb Est-mCnc: 123 mg/dL
Hgb A1c MFr Bld: 5.9 % — ABNORMAL HIGH (ref 4.8–5.6)

## 2022-04-29 LAB — TSH: TSH: 1.12 u[IU]/mL (ref 0.450–4.500)

## 2022-08-04 ENCOUNTER — Ambulatory Visit: Payer: 59 | Admitting: Podiatry

## 2022-08-04 DIAGNOSIS — M7752 Other enthesopathy of left foot: Secondary | ICD-10-CM

## 2022-08-04 NOTE — Progress Notes (Signed)
  Subjective:  Patient ID: Justin Serrano, male    DOB: [birth year],  MRN: [id number]  Chief Complaint  Patient presents with   Toe Pain    42 y.o. male presents with the above complaint.  Patient presents with left fifth metatarsophalangeal joint pain.  Patient states it came out of nowhere has been hurting with ambulation worse with pressure he has not seen anyone else prior to seeing me for this.  Would like to discuss treatment options for this.  He wears orthotics.  Hurts with ambulation worse with pressure would like to discuss injection options.   Review of Systems: Negative except as noted in the HPI. Denies N/V/F/Ch.  Past Medical History:  Diagnosis Date   Closed left ankle fracture     Current Outpatient Medications:    Hyoscyamine Sulfate SL (LEVSIN/SL) 0.125 MG SUBL, place 1 tablet under tongue by translingual route QID PRN abdominal pain and spasm Sublingual 4, Disp: , Rfl:   Social History   Tobacco Use  Smoking Status Never  Smokeless Tobacco Never    Allergies  Allergen Reactions   Ampicillin Other (See Comments)    Childhood allergy   Objective:  There were no vitals filed for this visit. There is no height or weight on file to calculate BMI. Constitutional Well developed. Well nourished.  Vascular Dorsalis pedis pulses palpable bilaterally. Posterior tibial pulses palpable bilaterally. Capillary refill normal to all digits.  No cyanosis or clubbing noted. Pedal hair growth normal.  Neurologic Normal speech. Oriented to person, place, and time. Epicritic sensation to light touch grossly present bilaterally.  Dermatologic Nails well groomed and normal in appearance. No open wounds. No skin lesions.  Orthopedic: Pain on palpation left fifth metatarsophalangeal joint pain with range of motion of the joint.  No deep intra-articular pain noted.  Mild pain with extensor or flexor tendon noted.   Radiographs: None Assessment:   1. Capsulitis of  metatarsophalangeal (MTP) joint of left foot    Plan:  Patient was evaluated and treated and all questions answered.  Left fifth MTP capsulitis -All questions and concerns were discussed with the patient in extensive detail given the amount of pain that he is having a benefit from a steroid injection of decrease inflammatory component associated pain.  Patient agrees with plan like to proceed with steroid injection. -A steroid injection was performed at left MTP using 1% plain Lidocaine and 10 mg of Kenalog. This was well tolerated.   No follow-ups on file.

## 2022-10-24 ENCOUNTER — Ambulatory Visit (INDEPENDENT_AMBULATORY_CARE_PROVIDER_SITE_OTHER): Payer: 59 | Admitting: Family Medicine

## 2022-10-24 VITALS — BP 134/84 | HR 82 | Temp 97.1°F | Resp 16 | Wt 270.2 lb

## 2022-10-24 DIAGNOSIS — M792 Neuralgia and neuritis, unspecified: Secondary | ICD-10-CM

## 2022-10-24 NOTE — Progress Notes (Signed)
Patient is here for their 6 month follow-up Patient has no concerns today Care gaps have been discussed with patient  

## 2022-10-24 NOTE — Progress Notes (Signed)
Established Patient Office Visit  Subjective    Patient ID: Justin Serrano, male    DOB: Oct 01, 1980  Age: 42 y.o. MRN: [id number]  CC:  Chief Complaint  Patient presents with   Follow-up    HPI Justin Serrano presents for complaint of recurrent numbness in left lower leg. Also reports some coolness. No changes with activity. Has noted no varicosities. Feels like there is a weakness.    Outpatient Encounter Medications as of 10/24/2022  Medication Sig   Hyoscyamine Sulfate SL (LEVSIN/SL) 0.125 MG SUBL place 1 tablet under tongue by translingual route QID PRN abdominal pain and spasm Sublingual 4   No facility-administered encounter medications on file as of 10/24/2022.    Past Medical History:  Diagnosis Date   Closed left ankle fracture     Past Surgical History:  Procedure Laterality Date   TONSILLECTOMY      Family History  Problem Relation Age of Onset   Healthy Mother    Stroke Mother    Healthy Father    Healthy Sister    Cancer Maternal Grandfather    Heart disease Neg Hx    Heart attack Neg Hx     Social History   Socioeconomic History   Marital status: Married    Spouse name: Not on file   Number of children: Not on file   Years of education: Not on file   Highest education level: Master's degree (e.g., MA, MS, MEng, MEd, MSW, MBA)  Occupational History   Not on file  Tobacco Use   Smoking status: Never   Smokeless tobacco: Never  Vaping Use   Vaping Use: Never used  Substance and Sexual Activity   Alcohol use: Yes    Alcohol/week: 2.0 standard drinks of alcohol    Types: 2 Cans of beer per week    Comment: occasionally   Drug use: Never   Sexual activity: Yes    Birth control/protection: Condom  Other Topics Concern   Not on file  Social History Narrative   Not on file   Social Determinants of Health   Financial Resource Strain: Low Risk  (10/24/2022)   Overall Financial Resource Strain (CARDIA)    Difficulty of Paying Living  Expenses: Not hard at all  Food Insecurity: No Food Insecurity (10/24/2022)   Hunger Vital Sign    Worried About Running Out of Food in the Last Year: Never true    Ran Out of Food in the Last Year: Never true  Transportation Needs: No Transportation Needs (10/24/2022)   PRAPARE - Hydrologist (Medical): No    Lack of Transportation (Non-Medical): No  Physical Activity: Sufficiently Active (10/24/2022)   Exercise Vital Sign    Days of Exercise per Week: 5 days    Minutes of Exercise per Session: 40 min  Stress: No Stress Concern Present (10/24/2022)   [place]    Feeling of Stress : Not at all  Social Connections: Unknown (10/24/2022)   Social Connection and Isolation Panel [NHANES]    Frequency of Communication with Friends and Family: Once a week    Frequency of Social Gatherings with Friends and Family: Once a week    Attends Religious Services: Patient declined    Marine scientist or Organizations: No    Attends Music therapist: Not on file    Marital Status: Married  Human resources officer Violence: Not on file  Review of Systems  Neurological:  Positive for tingling. Negative for dizziness and focal weakness.  All other systems reviewed and are negative.       Objective    BP 134/84   Pulse 82   Temp (!) 97.1 F (36.2 C) (Oral)   Resp 16   Wt 270 lb 3.2 oz (122.6 kg)   SpO2 95%   BMI 35.17 kg/m   Physical Exam Vitals and nursing note reviewed.  Constitutional:      General: He is not in acute distress. Cardiovascular:     Rate and Rhythm: Normal rate and regular rhythm.  Pulmonary:     Effort: Pulmonary effort is normal.     Breath sounds: Normal breath sounds.  Abdominal:     Palpations: Abdomen is soft.     Tenderness: There is no abdominal tenderness.  Neurological:     General: No focal deficit present.     Mental Status: He is alert and  oriented to person, place, and time.     Motor: No weakness.         Assessment & Plan:   1. Neuralgia Referral to neurology for further eval/mgt - Ambulatory referral to Neurology    Return if symptoms worsen or fail to improve.   Becky Sax, MD

## 2022-10-27 ENCOUNTER — Encounter: Payer: Self-pay | Admitting: Family Medicine

## 2022-11-21 ENCOUNTER — Telehealth: Payer: Self-pay | Admitting: Diagnostic Neuroimaging

## 2022-11-21 NOTE — Telephone Encounter (Signed)
Sent mychart msg informing pt of appointment change due to provider template change 

## 2023-01-02 ENCOUNTER — Encounter: Payer: Self-pay | Admitting: Diagnostic Neuroimaging

## 2023-01-02 ENCOUNTER — Ambulatory Visit: Payer: 59 | Admitting: Diagnostic Neuroimaging

## 2023-01-02 VITALS — BP 128/90 | HR 68 | Ht 73.0 in | Wt 259.0 lb

## 2023-01-02 DIAGNOSIS — G629 Polyneuropathy, unspecified: Secondary | ICD-10-CM | POA: Diagnosis not present

## 2023-01-02 NOTE — Progress Notes (Signed)
[facility]  PATIENT: Justin Serrano DOB: June 01, 1981  REFERRING CLINICIAN: Georganna Skeans, MD HISTORY FROM: patient  REASON FOR VISIT: new consult   HISTORICAL  CHIEF COMPLAINT:  Chief Complaint  Patient presents with   New Patient (Initial Visit)   Numbness    Rm 6, pt here  C/o numbness in left lower leg, last yr in June after taking steroids for bronchitis developed tingling on left leg after 8 days of taking meds. Has noticed from feb/2024 till now more tingling on on calf and left foot has been numb after receiving a cortisone injection on the foot.     HISTORY OF PRESENT ILLNESS:   42 year old male here for evaluation of left foot and calf numbness and tingling.  Symptoms started around June 2023 when he was 8 days in of a 10-day prednisone course for bronchitis.  He felt some tingling and cold sensation in his left calf and foot.  Symptoms eventually resolved after stopping prednisone.  November 2023 he banged his foot in a door jam and had recurrence of left foot pain and numbness.  Of note patient has had some longstanding high arches and hammertoes bilaterally.  No family history of high arches or neuropathy.  No significant numbness or tingling.  No pain.  No weakness.  No symptoms in hands or fingers.  No neck pain or low back pain.    REVIEW OF SYSTEMS: Full 14 system review of systems performed and negative with exception of: as per HPI.  ALLERGIES: Allergies  Allergen Reactions   Ampicillin Other (See Comments)    Childhood allergy    HOME MEDICATIONS: No outpatient medications prior to visit.   No facility-administered medications prior to visit.    PAST MEDICAL HISTORY: Past Medical History:  Diagnosis Date   Closed left ankle fracture     PAST SURGICAL HISTORY: Past Surgical History:  Procedure Laterality Date   TONSILLECTOMY     TONSILLECTOMY AND ADENOIDECTOMY      FAMILY HISTORY: Family History  Problem Relation Age  of Onset   Healthy Mother    Stroke Mother    Healthy Father    Healthy Sister    Cancer Maternal Grandfather    Heart disease Neg Hx    Heart attack Neg Hx     SOCIAL HISTORY: Social History   Socioeconomic History   Marital status: Married    Spouse name: Not on file   Number of children: Not on file   Years of education: Not on file   Highest education level: Master's degree (e.g., MA, MS, MEng, MEd, MSW, MBA)  Occupational History   Not on file  Tobacco Use   Smoking status: Never   Smokeless tobacco: Never  Vaping Use   Vaping Use: Never used  Substance and Sexual Activity   Alcohol use: Yes    Alcohol/week: 2.0 standard drinks of alcohol    Types: 2 Cans of beer per week    Comment: occasionally   Drug use: Never   Sexual activity: Yes    Birth control/protection: Condom  Other Topics Concern   Not on file  Social History Narrative   Not on file   Social Determinants of Health   Financial Resource Strain: Low Risk  (10/24/2022)   Overall Financial Resource Strain (CARDIA)    Difficulty of Paying Living Expenses: Not hard at all  Food Insecurity: No Food Insecurity (10/24/2022)   Hunger Vital Sign    Worried About Running Out of  Food in the Last Year: Never true    Ran Out of Food in the Last Year: Never true  Transportation Needs: No Transportation Needs (10/24/2022)   PRAPARE - Administrator, Civil Service (Medical): No    Lack of Transportation (Non-Medical): No  Physical Activity: Sufficiently Active (10/24/2022)   Exercise Vital Sign    Days of Exercise per Week: 5 days    Minutes of Exercise per Session: 40 min  Stress: No Stress Concern Present (10/24/2022)   [employer] of Occupational Health - Occupational Stress Questionnaire    Feeling of Stress : Not at all  Social Connections: Unknown (10/24/2022)   Social Connection and Isolation Panel [NHANES]    Frequency of Communication with Friends and Family: Once a week    Frequency  of Social Gatherings with Friends and Family: Once a week    Attends Religious Services: Patient declined    Database administrator or Organizations: No    Attends Engineer, structural: Not on file    Marital Status: Married  Catering manager Violence: Not on file     PHYSICAL EXAM  GENERAL EXAM/CONSTITUTIONAL: Vitals:  Vitals:   01/02/23 1400  BP: (!) 128/90  Pulse: 68  Weight: 259 lb (117.5 kg)  Height: 6\' 1"  (1.854 m)   Body mass index is 34.17 kg/m. Wt Readings from Last 3 Encounters:  01/02/23 259 lb (117.5 kg)  10/24/22 270 lb 3.2 oz (122.6 kg)  04/28/22 290 lb 6.4 oz (131.7 kg)   Patient is in no distress; well developed, nourished and groomed; neck is supple  CARDIOVASCULAR: Examination of carotid arteries is normal; no carotid bruits Regular rate and rhythm, no murmurs Examination of peripheral vascular system by observation and palpation is normal  EYES: Ophthalmoscopic exam of optic discs and posterior segments is normal; no papilledema or hemorrhages No results found.  MUSCULOSKELETAL: Gait, strength, tone, movements noted in Neurologic exam below  NEUROLOGIC: MENTAL STATUS:      No data to display         awake, alert, oriented to person, place and time recent and remote memory intact normal attention and concentration language fluent, comprehension intact, naming intact fund of knowledge appropriate  CRANIAL NERVE:  2nd - no papilledema on fundoscopic exam 2nd, 3rd, 4th, 6th - pupils equal and reactive to light, visual fields full to confrontation, extraocular muscles intact, no nystagmus 5th - facial sensation symmetric 7th - facial strength symmetric 8th - hearing intact 9th - palate elevates symmetrically, uvula midline 11th - shoulder shrug symmetric 12th - tongue protrusion midline  MOTOR:  HIGH ARCHES; HAMMER TOES normal bulk and tone, full strength in the BUE, BLE; EXCEPT LEFT FOOT TOE ABDUCTION AND LEFT FOOT EVERSION  SLIGHT WEAKNESS  SENSORY:  normal and symmetric to light touch, pinprick, temperature, vibration; EXCEPT DECR PP IN BILATERAL FEET / TOES  COORDINATION:  finger-nose-finger, fine finger movements normal  REFLEXES:  deep tendon reflexes 2+ and symmetric; ABSENT AT ANKLES  GAIT/STATION:  narrow based gait; ABLE TO WALK TOES, HEELS; SLIGHT DIFF WITH TANDEM; ROMBERG NEGATIVE     DIAGNOSTIC DATA (LABS, IMAGING, TESTING) - I reviewed patient records, labs, notes, testing and imaging myself where available.  Lab Results  Component Value Date   WBC 5.1 04/28/2022   HGB 15.6 04/28/2022   HCT 46.9 04/28/2022   MCV 89 04/28/2022   PLT 206 04/28/2022      Component Value Date/Time   NA 140 04/28/2022 1120  K 4.4 04/28/2022 1120   CL 103 04/28/2022 1120   CO2 20 04/28/2022 1120   GLUCOSE 88 04/28/2022 1120   GLUCOSE 103 (H) 08/24/2021 0853   BUN 10 04/28/2022 1120   CREATININE 0.80 04/28/2022 1120   CALCIUM 9.7 04/28/2022 1120   PROT 7.2 04/28/2022 1120   ALBUMIN 4.7 04/28/2022 1120   AST 40 04/28/2022 1120   ALT 28 04/28/2022 1120   ALKPHOS 49 04/28/2022 1120   BILITOT 0.7 04/28/2022 1120   GFRNONAA >60 08/24/2021 0853   GFRAA 131 03/09/2020 1510   Lab Results  Component Value Date   CHOL 142 04/28/2022   HDL 39 (L) 04/28/2022   LDLCALC 87 04/28/2022   TRIG 84 04/28/2022   CHOLHDL 3.6 04/28/2022   Lab Results  Component Value Date   HGBA1C 5.9 (H) 04/28/2022   No results found for: "VITAMINB12" Lab Results  Component Value Date   TSH 1.120 04/28/2022       ASSESSMENT AND PLAN  42 y.o. year old male here with history of high arches and hammertoes, here for evaluation of left foot numbness since June 2023 (mild intermittent).    Dx:  1. Neuropathy       PLAN:  NUMBNESS LEFT FOOT / HIGH ARCHES / HAMMERTOES (will proceed with neuropathy evaluation; could represent acquired neuropathy versus hereditary neuropathy) - check EMG/NCS - check neuropathy  labs  Orders Placed This Encounter  Procedures   SPEP with IFE   ANA w/Reflex   SSA, SSB   GM1 IgG Autoantibodies   ANCA Profile   Vitamin B12   Copper   NCV with EMG(electromyography)   Return for for NCV/EMG.    Suanne Marker, MD 01/02/2023, 2:30 PM Certified in Neurology, Neurophysiology and Neuroimaging  [facility] [address], [address] [place], [place] [address] [phone]

## 2023-01-05 ENCOUNTER — Ambulatory Visit (INDEPENDENT_AMBULATORY_CARE_PROVIDER_SITE_OTHER): Payer: 59

## 2023-01-05 ENCOUNTER — Ambulatory Visit: Payer: 59 | Admitting: Podiatry

## 2023-01-05 DIAGNOSIS — M7752 Other enthesopathy of left foot: Secondary | ICD-10-CM

## 2023-01-05 DIAGNOSIS — M205X2 Other deformities of toe(s) (acquired), left foot: Secondary | ICD-10-CM | POA: Diagnosis not present

## 2023-01-05 LAB — ANA W/REFLEX: ANA Titer 1: NEGATIVE

## 2023-01-05 NOTE — Progress Notes (Signed)
  Subjective:  Patient ID: Justin Serrano, male    DOB: Sep 26, 1980,  MRN: [id number]  Chief Complaint  Patient presents with   Foot Pain    Patient would like to compare xrays from the previous year of 2019    42 y.o. male presents with the above complaint.  Patient presents with follow-up of left first metatarsophalangeal joint pain with underlying hallux limitus.  He states is doing better he started noticing some of the pain the injection has helped in the past.  He also wanted to get another x-ray to make sure there is no changes.   Review of Systems: Negative except as noted in the HPI. Denies N/V/F/Ch.  Past Medical History:  Diagnosis Date   Closed left ankle fracture    No current outpatient medications on file.  Social History   Tobacco Use  Smoking Status Never  Smokeless Tobacco Never    Allergies  Allergen Reactions   Ampicillin Other (See Comments)    Childhood allergy   Objective:  There were no vitals filed for this visit. There is no height or weight on file to calculate BMI. Constitutional Well developed. Well nourished.  Vascular Dorsalis pedis pulses palpable bilaterally. Posterior tibial pulses palpable bilaterally. Capillary refill normal to all digits.  No cyanosis or clubbing noted. Pedal hair growth normal.  Neurologic Normal speech. Oriented to person, place, and time. Epicritic sensation to light touch grossly present bilaterally.  Dermatologic Nails well groomed and normal in appearance. No open wounds. No skin lesions.  Orthopedic: Mild pain on palpation left fifth metatarsophalangeal joint pain with range of motion of the joint.  No deep intra-articular pain noted.  Mild pain with extensor or flexor tendon noted.   Radiographs: 3 views of skeletally mature the left foot:Hallux limitus noted with flattening of the metatarsal head with uneven joint space narrowing slightly.  No other abnormalities identified Assessment:   1. Capsulitis of  metatarsophalangeal (MTP) joint of left foot   2. Hallux limitus, left    Plan:  Patient was evaluated and treated and all questions answered.  Left fifth MTP capsulitis with underlying left hallux limitus -All questions and concerns were discussed with the patient in extensive detail given the amount of pain tha clinically at this time patient is able to manage the pain and would like to hold off on the steroid injection I discussed shoe gear modification -Patient already has orthotics and is functioning well. -If any foot and ankle issues arise in the future he will come back and see me   No follow-ups on file.   Left hallux limitus.  Not painful will manage with shoe gear modification and has orthotics already

## 2023-01-09 LAB — MULTIPLE MYELOMA PANEL, SERUM
Albumin SerPl Elph-Mcnc: 4 g/dL (ref 2.9–4.4)
Albumin/Glob SerPl: 1.3 (ref 0.7–1.7)
Alpha 1: 0.2 g/dL (ref 0.0–0.4)
Alpha2 Glob SerPl Elph-Mcnc: 0.7 g/dL (ref 0.4–1.0)
B-Globulin SerPl Elph-Mcnc: 1 g/dL (ref 0.7–1.3)
Gamma Glob SerPl Elph-Mcnc: 1.3 g/dL (ref 0.4–1.8)
Globulin, Total: 3.2 g/dL (ref 2.2–3.9)
IgA/Immunoglobulin A, Serum: 334 mg/dL (ref 90–386)
IgG (Immunoglobin G), Serum: 1186 mg/dL (ref 603–1613)
IgM (Immunoglobulin M), Srm: 153 mg/dL (ref 20–172)
Total Protein: 7.2 g/dL (ref 6.0–8.5)

## 2023-01-09 LAB — VITAMIN E
Vitamin E (Alpha Tocopherol): 8 mg/L (ref 7.0–25.1)
Vitamin E(Gamma Tocopherol): 0.9 mg/L (ref 0.5–5.5)

## 2023-01-09 LAB — SJOGREN'S SYNDROME ANTIBODS(SSA + SSB)
ENA SSA (RO) Ab: <0.2 AI (ref 0.0–0.9)
ENA SSB (LA) Ab: <0.2 AI (ref 0.0–0.9)

## 2023-01-09 LAB — ANCA PROFILE
Anti-MPO Antibodies: <0.2 units (ref 0.0–0.9)
Anti-PR3 Antibodies: <0.2 units (ref 0.0–0.9)
Atypical pANCA: <1:20 {titer}
C-ANCA: <1:20 {titer}
P-ANCA: <1:20 {titer}

## 2023-01-09 LAB — COPPER, SERUM: Copper: 163 ug/dL — ABNORMAL HIGH (ref 69–132)

## 2023-01-09 LAB — INTERPRETATION

## 2023-01-09 LAB — VITAMIN B12: Vitamin B-12: 200 pg/mL — ABNORMAL LOW (ref 232–1245)

## 2023-01-09 LAB — GM1 IGG AUTOANTIBODIES: GM1 IgG Autoantibodies: <20 % (ref 0–30)

## 2023-01-11 ENCOUNTER — Ambulatory Visit: Payer: 59 | Admitting: Diagnostic Neuroimaging

## 2023-02-13 ENCOUNTER — Telehealth: Payer: Self-pay | Admitting: Diagnostic Neuroimaging

## 2023-02-13 NOTE — Telephone Encounter (Signed)
LVM and sent mychart msg informing pt of appt change due to provider schedule change 

## 2023-02-15 ENCOUNTER — Ambulatory Visit (INDEPENDENT_AMBULATORY_CARE_PROVIDER_SITE_OTHER): Payer: 59 | Admitting: Diagnostic Neuroimaging

## 2023-02-15 ENCOUNTER — Ambulatory Visit: Payer: Self-pay | Admitting: Diagnostic Neuroimaging

## 2023-02-15 DIAGNOSIS — Z0289 Encounter for other administrative examinations: Secondary | ICD-10-CM

## 2023-02-15 DIAGNOSIS — G629 Polyneuropathy, unspecified: Secondary | ICD-10-CM | POA: Diagnosis not present

## 2023-02-19 NOTE — Procedures (Signed)
[facility]  NCS (NERVE CONDUCTION STUDY) WITH EMG (ELECTROMYOGRAPHY) REPORT   STUDY DATE: 02/15/23 PATIENT NAME: Justin Serrano DOB: 1981-02-13 MRN: [id number]  ORDERING CLINICIAN: Joycelyn Schmid, MD   TECHNOLOGIST: Jenelle Mages ELECTROMYOGRAPHER: Glenford Bayley. Compton Brigance, MD  CLINICAL INFORMATION: 42 year old male with numbness and tingling in feet, high arches and hammertoes.  FINDINGS: NERVE CONDUCTION STUDY:  Right median and ulnar motor responses are normal.  Right ulnar F-wave latency is normal.  Bilateral peroneal and tibial motor responses could not be obtained.  Right radial sensory response is normal.  Right median and right ulnar sensory responses have slightly prolonged peak latencies, decreased amplitude of the right median response.  Bilateral sural and superficial peroneal sensory responses could not be obtained.   NEEDLE ELECTROMYOGRAPHY:  Needle examination of bilateral lower extremities notable for chronic denervation in bilateral tibialis anterior and right gastrocnemius muscles.  No abnormal spontaneous activity.   IMPRESSION:   Abnormal study demonstrating: - Severe length dependent axonal sensorimotor polyneuropathy.    INTERPRETING PHYSICIAN:  Suanne Marker, MD Certified in Neurology, Neurophysiology and Neuroimaging  [facility] [address], [address] [place], [place] [address] [phone]   [facility]    Nerve / Sites Muscle Latency Ref. Amplitude Ref. Rel Amp Segments Distance Velocity Ref. Area    ms ms mV mV %  cm m/s m/s mVms  R Median - APB     Wrist APB 4.1 ?4.4 8.4 ?4.0 100 Wrist - APB 7   37.4     Upper arm APB 10.0  8.1  96.8 Upper arm - Wrist 31 53 ?49 37.5  R Ulnar - ADM     Wrist ADM 3.3 ?3.3 10.5 ?6.0 100 Wrist - ADM 7   38.3     B.Elbow ADM 6.3  9.9  94.3 B.Elbow - Wrist 15.6 53 ?49 35.8     A.Elbow ADM 9.6  9.2  92.7 A.Elbow - B.Elbow 17 51 ?49 34.2  L Peroneal - EDB     Ankle EDB  NR ?6.5 NR ?2.0 NR Ankle - EDB 9   NR         Pop fossa - Ankle      R Peroneal - EDB     Ankle EDB NR ?6.5 NR ?2.0 NR Ankle - EDB 9   NR         Pop fossa - Ankle      L Tibial - AH     Ankle AH NR ?5.8 NR ?4.0 NR Ankle - AH 9   NR  R Tibial - AH     Ankle AH NR ?5.8 NR ?4.0 NR Ankle - AH 9   NR                 SNC    Nerve / Sites Rec. Site Peak Lat Ref.  Amp Ref. Segments Distance    ms ms V V  cm  R Radial - Anatomical snuff box (Forearm)     Forearm Wrist 2.7 ?2.9 15 ?15 Forearm - Wrist 10  L Sural - Ankle (Calf)     Calf Ankle NR ?4.4 NR ?6 Calf - Ankle 14  R Sural - Ankle (Calf)     Calf Ankle NR ?4.4 NR ?6 Calf - Ankle 14  L Superficial peroneal - Ankle     Lat leg Ankle NR ?4.4 NR ?6 Lat leg - Ankle 14  R Superficial peroneal - Ankle  Lat leg Ankle NR ?4.4 NR ?6 Lat leg - Ankle 14  R Median - Orthodromic (Dig II, Mid palm)     Dig II Wrist 3.8 ?3.4 7 ?10 Dig II - Wrist 13  R Ulnar - Orthodromic, (Dig V, Mid palm)     Dig V Wrist 3.5 ?3.1 6 ?5 Dig V - Wrist 21                   F  Wave    Nerve F Lat Ref.   ms ms  R Ulnar - ADM 30.7 ?32.0       EMG Summary Table    Spontaneous MUAP Recruitment  Muscle IA Fib PSW Fasc Other Amp Dur. Poly Pattern  R. Vastus medialis Normal None None None _______ Normal Normal Normal Normal  R. Tibialis anterior Normal None None None _______ Increased Normal Normal Reduced  R. Gastrocnemius (Medial head) Normal None None None _______ Increased Normal Normal Reduced  L. Tibialis anterior Normal None None None _______ Giant Normal Normal Discrete

## 2023-03-12 ENCOUNTER — Telehealth: Payer: Self-pay | Admitting: Family Medicine

## 2023-03-12 NOTE — Telephone Encounter (Signed)
Called pt and left a vm to call office back to schedule physical appt requested via MyChart. Pt is due for a physical on/after 04/29/2023.

## 2023-05-09 ENCOUNTER — Ambulatory Visit (INDEPENDENT_AMBULATORY_CARE_PROVIDER_SITE_OTHER): Payer: 59 | Admitting: Family Medicine

## 2023-05-09 ENCOUNTER — Encounter: Payer: Self-pay | Admitting: Family Medicine

## 2023-05-09 VITALS — BP 128/86 | HR 84 | Temp 99.1°F | Resp 16 | Ht 73.5 in | Wt 250.4 lb

## 2023-05-09 DIAGNOSIS — Z0000 Encounter for general adult medical examination without abnormal findings: Secondary | ICD-10-CM

## 2023-05-09 DIAGNOSIS — Z13220 Encounter for screening for lipoid disorders: Secondary | ICD-10-CM | POA: Diagnosis not present

## 2023-05-09 DIAGNOSIS — Z130 Encounter for screening for diseases of the blood and blood-forming organs and certain disorders involving the immune mechanism: Secondary | ICD-10-CM

## 2023-05-09 DIAGNOSIS — Z125 Encounter for screening for malignant neoplasm of prostate: Secondary | ICD-10-CM | POA: Diagnosis not present

## 2023-05-10 LAB — CMP14+EGFR
ALT: 24 [IU]/L (ref 0–44)
AST: 30 [IU]/L (ref 0–40)
Albumin: 4.6 g/dL (ref 4.1–5.1)
Alkaline Phosphatase: 56 [IU]/L (ref 44–121)
BUN/Creatinine Ratio: 11 (ref 9–20)
BUN: 9 mg/dL (ref 6–24)
Bilirubin Total: 0.8 mg/dL (ref 0.0–1.2)
CO2: 23 mmol/L (ref 20–29)
Calcium: 9.7 mg/dL (ref 8.7–10.2)
Chloride: 103 mmol/L (ref 96–106)
Creatinine, Ser: 0.84 mg/dL (ref 0.76–1.27)
Globulin, Total: 2.3 g/dL (ref 1.5–4.5)
Glucose: 88 mg/dL (ref 70–99)
Potassium: 4.3 mmol/L (ref 3.5–5.2)
Sodium: 141 mmol/L (ref 134–144)
Total Protein: 6.9 g/dL (ref 6.0–8.5)
eGFR: 112 mL/min/{1.73_m2} (ref >59)

## 2023-05-10 LAB — CBC WITH DIFFERENTIAL/PLATELET
Basophils Absolute: 0.1 10*3/uL (ref 0.0–0.2)
Basos: 1 %
EOS (ABSOLUTE): 0.1 10*3/uL (ref 0.0–0.4)
Eos: 2 %
Hematocrit: 46.5 % (ref 37.5–51.0)
Hemoglobin: 15.2 g/dL (ref 13.0–17.7)
Immature Grans (Abs): 0 10*3/uL (ref 0.0–0.1)
Immature Granulocytes: 0 %
Lymphocytes Absolute: 3.2 10*3/uL — ABNORMAL HIGH (ref 0.7–3.1)
Lymphs: 60 %
MCH: 30.3 pg (ref 26.6–33.0)
MCHC: 32.7 g/dL (ref 31.5–35.7)
MCV: 93 fL (ref 79–97)
Monocytes Absolute: 0.3 10*3/uL (ref 0.1–0.9)
Monocytes: 6 %
Neutrophils Absolute: 1.7 10*3/uL (ref 1.4–7.0)
Neutrophils: 31 %
Platelets: 195 10*3/uL (ref 150–450)
RBC: 5.02 x10E6/uL (ref 4.14–5.80)
RDW: 13.8 % (ref 11.6–15.4)
WBC: 5.4 10*3/uL (ref 3.4–10.8)

## 2023-05-10 LAB — LIPID PANEL
Chol/HDL Ratio: 2.9 {ratio} (ref 0.0–5.0)
Cholesterol, Total: 137 mg/dL (ref 100–199)
HDL: 47 mg/dL (ref >39)
LDL Chol Calc (NIH): 76 mg/dL (ref 0–99)
Triglycerides: 66 mg/dL (ref 0–149)
VLDL Cholesterol Cal: 14 mg/dL (ref 5–40)

## 2023-05-10 LAB — PSA: Prostate Specific Ag, Serum: 0.9 ng/mL (ref 0.0–4.0)

## 2023-05-10 NOTE — Progress Notes (Signed)
Established Patient Office Visit  Subjective    Patient ID: Justin Serrano, male    DOB: [birth year]  Age: 42 y.o. MRN: [id number]  CC:  Chief Complaint  Patient presents with   Annual Exam    HPI Justin Serrano presents for routine annual exam. Patient denies acute complaints.   No outpatient encounter medications on file as of 05/09/2023.   No facility-administered encounter medications on file as of 05/09/2023.    Past Medical History:  Diagnosis Date   Closed left ankle fracture     Past Surgical History:  Procedure Laterality Date   TONSILLECTOMY     TONSILLECTOMY AND ADENOIDECTOMY      Family History  Problem Relation Age of Onset   Healthy Mother    Stroke Mother    Healthy Father    Healthy Sister    Cancer Maternal Grandfather    Heart disease Neg Hx    Heart attack Neg Hx     Social History   Socioeconomic History   Marital status: Married    Spouse name: Not on file   Number of children: Not on file   Years of education: Not on file   Highest education level: Master's degree (e.g., MA, MS, MEng, MEd, MSW, MBA)  Occupational History   Not on file  Tobacco Use   Smoking status: Never   Smokeless tobacco: Never  Vaping Use   Vaping status: Never Used  Substance and Sexual Activity   Alcohol use: Yes    Alcohol/week: 2.0 standard drinks of alcohol    Types: 2 Cans of beer per week    Comment: occasionally   Drug use: Never   Sexual activity: Yes    Birth control/protection: Condom  Other Topics Concern   Not on file  Social History Narrative   Not on file   Social Determinants of Health   Financial Resource Strain: Low Risk  (10/24/2022)   Overall Financial Resource Strain (CARDIA)    Difficulty of Paying Living Expenses: Not hard at all  Food Insecurity: No Food Insecurity (10/24/2022)   Hunger Vital Sign    Worried About Running Out of Food in the Last Year: Never true    Ran Out of Food in the Last Year: Never true  Transportation  Needs: No Transportation Needs (10/24/2022)   PRAPARE - Administrator, Civil Service (Medical): No    Lack of Transportation (Non-Medical): No  Physical Activity: Sufficiently Active (10/24/2022)   Exercise Vital Sign    Days of Exercise per Week: 5 days    Minutes of Exercise per Session: 40 min  Stress: No Stress Concern Present (05/09/2023)   [employer] of Occupational Health - Occupational Stress Questionnaire    Feeling of Stress : Not at all  Social Connections: Unknown (10/24/2022)   Social Connection and Isolation Panel [NHANES]    Frequency of Communication with Friends and Family: Once a week    Frequency of Social Gatherings with Friends and Family: Once a week    Attends Religious Services: Patient declined    Database administrator or Organizations: No    Attends Engineer, structural: Not on file    Marital Status: Married  Catering manager Violence: Not At Risk (05/09/2023)   Humiliation, Afraid, Rape, and Kick questionnaire    Fear of Current or Ex-Partner: No    Emotionally Abused: No    Physically Abused: No    Sexually Abused: No  Review of Systems  All other systems reviewed and are negative.       Objective    BP 128/86 (BP Location: Right Arm, Patient Position: Sitting, Cuff Size: Large)   Pulse 84   Temp 99.1 F (37.3 C) (Oral)   Resp 16   Ht 6' 1.5" (1.867 m)   Wt 250 lb 6.4 oz (113.6 kg)   SpO2 96%   BMI 32.59 kg/m   Physical Exam Vitals and nursing note reviewed.  Constitutional:      General: He is not in acute distress. HENT:     Head: Normocephalic and atraumatic.     Right Ear: Tympanic membrane, ear canal and external ear normal.     Left Ear: Tympanic membrane, ear canal and external ear normal.     Nose: Nose normal.     Mouth/Throat:     Mouth: Mucous membranes are moist.     Pharynx: Oropharynx is clear.  Eyes:     Conjunctiva/sclera: Conjunctivae normal.     Pupils: Pupils are equal, round, and  reactive to light.  Neck:     Thyroid: No thyromegaly.  Cardiovascular:     Rate and Rhythm: Normal rate and regular rhythm.     Heart sounds: Normal heart sounds. No murmur heard. Pulmonary:     Effort: Pulmonary effort is normal.     Breath sounds: Normal breath sounds.  Abdominal:     General: There is no distension.     Palpations: Abdomen is soft. There is no mass.     Tenderness: There is no abdominal tenderness.     Hernia: There is no hernia in the left inguinal area or right inguinal area.  Genitourinary:    Penis: Normal and circumcised.      Testes: Normal.     Comments: Right testes is sig smaller than left Musculoskeletal:        General: Normal range of motion.     Cervical back: Normal range of motion and neck supple.     Right lower leg: No edema.     Left lower leg: No edema.  Skin:    General: Skin is warm and dry.  Neurological:     General: No focal deficit present.     Mental Status: He is alert and oriented to person, place, and time. Mental status is at baseline.  Psychiatric:        Mood and Affect: Mood normal.        Behavior: Behavior normal.         Assessment & Plan:   Annual physical exam -     CMP14+EGFR  Screening for deficiency anemia -     CBC with Differential/Platelet  Screening for lipid disorders -     Lipid panel  Screening for prostate cancer -     PSA     No follow-ups on file.   Tommie Raymond, MD

## 2023-07-11 ENCOUNTER — Encounter: Payer: Self-pay | Admitting: Family Medicine

## 2023-07-11 DIAGNOSIS — Z130 Encounter for screening for diseases of the blood and blood-forming organs and certain disorders involving the immune mechanism: Secondary | ICD-10-CM

## 2023-07-27 ENCOUNTER — Ambulatory Visit: Payer: 59 | Admitting: Family Medicine

## 2023-07-27 ENCOUNTER — Encounter: Payer: Self-pay | Admitting: Family Medicine

## 2023-07-27 VITALS — BP 128/88 | HR 85 | Temp 98.1°F | Resp 16 | Ht 73.0 in | Wt 258.8 lb

## 2023-07-27 DIAGNOSIS — E538 Deficiency of other specified B group vitamins: Secondary | ICD-10-CM | POA: Diagnosis not present

## 2023-07-27 DIAGNOSIS — R7303 Prediabetes: Secondary | ICD-10-CM | POA: Diagnosis not present

## 2023-07-27 DIAGNOSIS — Z23 Encounter for immunization: Secondary | ICD-10-CM

## 2023-07-27 NOTE — Progress Notes (Signed)
Established Patient Office Visit  Subjective    Patient ID: Justin Serrano, male    DOB: 07-23-1981  Age: 42 y.o. MRN: [id number]  CC:  Chief Complaint  Patient presents with   Follow-up    A1c, B12    HPI Justin Serrano presents for follow up of prediabetes and vitamin b12 deficiency. He reports that his prior complaint of intermittent neuralgia was evaluated by specialist with possible attributes to B12 deficiency,. Patient denies acute complaints.   No outpatient encounter medications on file as of 07/27/2023.   No facility-administered encounter medications on file as of 07/27/2023.    Past Medical History:  Diagnosis Date   Closed left ankle fracture     Past Surgical History:  Procedure Laterality Date   TONSILLECTOMY     TONSILLECTOMY AND ADENOIDECTOMY      Family History  Problem Relation Age of Onset   Healthy Mother    Stroke Mother    Healthy Father    Healthy Sister    Cancer Maternal Grandfather    Heart disease Neg Hx    Heart attack Neg Hx     Social History   Socioeconomic History   Marital status: Married    Spouse name: Not on file   Number of children: Not on file   Years of education: Not on file   Highest education level: Master's degree (e.g., MA, MS, MEng, MEd, MSW, MBA)  Occupational History   Not on file  Tobacco Use   Smoking status: Never   Smokeless tobacco: Never  Vaping Use   Vaping status: Never Used  Substance and Sexual Activity   Alcohol use: Yes    Alcohol/week: 2.0 standard drinks of alcohol    Types: 2 Cans of beer per week    Comment: occasionally   Drug use: Never   Sexual activity: Yes    Birth control/protection: Condom  Other Topics Concern   Not on file  Social History Narrative   Not on file   Social Drivers of Health   Financial Resource Strain: Low Risk  (07/23/2023)   Overall Financial Resource Strain (CARDIA)    Difficulty of Paying Living Expenses: Not hard at all  Food Insecurity: No Food  Insecurity (07/23/2023)   Hunger Vital Sign    Worried About Running Out of Food in the Last Year: Never true    Ran Out of Food in the Last Year: Never true  Transportation Needs: No Transportation Needs (07/23/2023)   PRAPARE - Administrator, Civil Service (Medical): No    Lack of Transportation (Non-Medical): No  Physical Activity: Insufficiently Active (07/23/2023)   Exercise Vital Sign    Days of Exercise per Week: 3 days    Minutes of Exercise per Session: 40 min  Stress: No Stress Concern Present (07/23/2023)   [employer] of Occupational Health - Occupational Stress Questionnaire    Feeling of Stress : Only a little  Social Connections: Socially Isolated (07/23/2023)   Social Connection and Isolation Panel [NHANES]    Frequency of Communication with Friends and Family: Once a week    Frequency of Social Gatherings with Friends and Family: Once a week    Attends Religious Services: Never    Database administrator or Organizations: No    Attends Banker Meetings: Not on file    Marital Status: Married  Intimate Partner Violence: Not At Risk (05/09/2023)   Humiliation, Afraid, Rape, and Kick questionnaire  Fear of Current or Ex-Partner: No    Emotionally Abused: No    Physically Abused: No    Sexually Abused: No    Review of Systems  All other systems reviewed and are negative.       Objective    BP 128/88 (BP Location: Right Arm, Patient Position: Sitting, Cuff Size: Large)   Pulse 85   Temp 98.1 F (36.7 C) (Oral)   Resp 16   Ht 5\' 7"  (1.702 m)   Wt 258 lb 12.8 oz (117.4 kg)   SpO2 96%   BMI 40.53 kg/m   Physical Exam Vitals and nursing note reviewed.  Constitutional:      General: He is not in acute distress. Cardiovascular:     Rate and Rhythm: Normal rate and regular rhythm.  Pulmonary:     Effort: Pulmonary effort is normal.     Breath sounds: Normal breath sounds.  Neurological:     General: No focal deficit  present.     Mental Status: He is alert and oriented to person, place, and time.         Assessment & Plan:   Prediabetes -     Hemoglobin A1c  Vitamin B 12 deficiency -     Vitamin B1  Encounter for immunization -     Flu vaccine trivalent PF, 6mos and older(Flulaval,Afluria,Fluarix,Fluzone)  Immunization due -     Tdap vaccine greater than or equal to 7yo IM     Return if symptoms worsen or fail to improve.   Tommie Raymond, MD

## 2023-07-28 LAB — VITAMIN B1

## 2023-07-28 LAB — HEMOGLOBIN A1C
Est. average glucose Bld gHb Est-mCnc: 117 mg/dL
Hgb A1c MFr Bld: 5.7 % — ABNORMAL HIGH (ref 4.8–5.6)

## 2023-08-07 NOTE — Telephone Encounter (Signed)
 Order has been placed.

## 2023-12-30 ENCOUNTER — Other Ambulatory Visit: Payer: Self-pay

## 2023-12-30 ENCOUNTER — Ambulatory Visit
Admission: RE | Admit: 2023-12-30 | Discharge: 2023-12-30 | Disposition: A | Discharge status: Home / Self Care | Source: Ambulatory Visit | Attending: Physician Assistant

## 2023-12-30 VITALS — BP 126/80 | HR 80 | Temp 98.5°F | Resp 15 | Ht 73.5 in | Wt 255.0 lb

## 2023-12-30 DIAGNOSIS — G44209 Tension-type headache, unspecified, not intractable: Secondary | ICD-10-CM

## 2023-12-30 NOTE — Discharge Instructions (Signed)
 You are seen today with concerns of persistent headache.  At this time I suspect this is likely secondary to lack of sleep and potential muscle stress and strain.  These headaches are typically called a tension type headache and usually resolve with home measures and over-the-counter medications. I recommend trying NSAIDs such as ibuprofen, Aleve, Advil per your preference. You can also try warm compresses to the area, gentle massage, stretches and stress reduction to help with relieving your symptoms. We are collecting lab work to check for dehydration, anemia and low B12.  We will keep you updated on those results once they are available. As needed to help with your motion sickness you can try taking over-the-counter Dramamine.  Please note that some formulations of this can make you sleepy or drowsy so please do not take it if you need to drive or remain alert. If you feel like your symptoms are not improving or seem to be worsening I recommend following up with your primary care provider or return to urgent care for further evaluation.

## 2023-12-30 NOTE — ED Notes (Addendum)
 This RT attempted blood draw in left AC x1. This RT also attempted blood draw x1 in right AC x 1 successfully.

## 2023-12-30 NOTE — ED Provider Notes (Signed)
 [facility]    CSN: [id number] Arrival date & time: 12/30/23  1049      History   Chief Complaint Chief Complaint  Patient presents with   Headache    Lingering headache. Occasional bout of light dizziness. (No fainting. No loss of motor skills.) - Entered by patient   Dizziness    HPI Justin Serrano is a 43 y.o. male.   HPI  Pt reports he is having lingering headache along the right temple/parietal area He reports some mild occipital pain as well that radiates around to temples  He reports Thurs evening/Friday morning he did have some mild symptoms of lightheadedness and presyncope without LOC He also reports minor motion sickness with driving or playing certain video games  He denies low blood glucose- his wife is diabetic and checked it for him at home. He reports similar BP readings at home as in clinic He reports previous hx of getting headaches from talking for most of the day and states he had a day full of meetings on Thurs and did not sleep well for multiple nights this week which may be contributing to his symptoms  Interventions: Tylenol  did not seem to help much when administered on Friday    Past Medical History:  Diagnosis Date   Closed left ankle fracture     There are no active problems to display for this patient.   Past Surgical History:  Procedure Laterality Date   TONSILLECTOMY     TONSILLECTOMY AND ADENOIDECTOMY         Home Medications    Prior to Admission medications   Not on File    Family History Family History  Problem Relation Age of Onset   Healthy Mother    Stroke Mother    Healthy Father    Healthy Sister    Cancer Maternal Grandfather    Heart disease Neg Hx    Heart attack Neg Hx     Social History Social History   Tobacco Use   Smoking status: Never   Smokeless tobacco: Never  Vaping Use   Vaping status: Never Used  Substance Use Topics   Alcohol use: Yes    Alcohol/week: 2.0 standard drinks of  alcohol    Types: 2 Cans of beer per week    Comment: occasionally   Drug use: Never     Allergies   Ampicillin   Review of Systems Review of Systems  Constitutional:  Positive for fatigue. Negative for chills and fever.  Eyes:  Negative for photophobia and visual disturbance.  Gastrointestinal:  Positive for nausea (very minor with driving or when playing certain video games). Negative for vomiting.  Musculoskeletal:  Negative for back pain and myalgias.  Neurological:  Positive for light-headedness and headaches. Negative for syncope, facial asymmetry, speech difficulty, weakness and numbness.  Psychiatric/Behavioral:  Negative for confusion.      Physical Exam Triage Vital Signs ED Triage Vitals  Encounter Vitals Group     BP 12/30/23 1058 126/80     Systolic BP Percentile --      Diastolic BP Percentile --      Pulse Rate 12/30/23 1058 80     Resp 12/30/23 1058 15     Temp 12/30/23 1058 98.5 F (36.9 C)     Temp Source 12/30/23 1058 Oral     SpO2 12/30/23 1058 96 %     Weight 12/30/23 1058 255 lb (115.7 kg)     Height 12/30/23 1058 6'  1.5" (1.867 m)     Head Circumference --      Peak Flow --      Pain Score 12/30/23 1111 3     Pain Loc --      Pain Education --      Exclude from Growth Chart --    No data found.  Updated Vital Signs BP 126/80 (BP Location: Right Arm)   Pulse 80   Temp 98.5 F (36.9 C) (Oral)   Resp 15   Ht 6' 1.5" (1.867 m)   Wt 255 lb (115.7 kg)   SpO2 96%   BMI 33.19 kg/m   Visual Acuity Right Eye Distance:   Left Eye Distance:   Bilateral Distance:    Right Eye Near:   Left Eye Near:    Bilateral Near:     Physical Exam Vitals reviewed.  Constitutional:      General: He is awake. He is not in acute distress.    Appearance: Normal appearance. He is well-developed and well-groomed. He is not ill-appearing or toxic-appearing.  HENT:     Head: Normocephalic and atraumatic.     Mouth/Throat:     Mouth: Mucous membranes are  moist.     Pharynx: Oropharynx is clear.  Eyes:     General: Lids are normal. Gaze aligned appropriately.     Extraocular Movements: Extraocular movements intact.     Right eye: Normal extraocular motion and no nystagmus.     Left eye: Normal extraocular motion and no nystagmus.     Conjunctiva/sclera: Conjunctivae normal.     Pupils: Pupils are equal, round, and reactive to light.  Cardiovascular:     Rate and Rhythm: Normal rate and regular rhythm.     Heart sounds: Normal heart sounds.  Pulmonary:     Effort: Pulmonary effort is normal.     Breath sounds: Normal breath sounds.  Musculoskeletal:     Cervical back: Normal range of motion and neck supple.  Skin:    General: Skin is warm and dry.  Neurological:     General: No focal deficit present.     Mental Status: He is alert and oriented to person, place, and time.     GCS: GCS eye subscore is 4. GCS verbal subscore is 5. GCS motor subscore is 6.     Cranial Nerves: No cranial nerve deficit, dysarthria or facial asymmetry.     Motor: No weakness, tremor, atrophy or abnormal muscle tone.     Comments: Comments: MENTAL STATUS: AAOx3, memory intact, fund of knowledge appropriate   LANG/SPEECH: Naming and repetition intact, fluent, no dysarthria, follows 3-step commands, answers questions appropriately     CRANIAL NERVES:   II: Pupils equal and reactive, no RAPD   III, IV, VI: EOM intact, no gaze preference or deviation, no nystagmus.   V: normal sensation in V1, V2, and V3 segments bilaterally   VII: no asymmetry, no nasolabial fold flattening   VIII: normal hearing to speech   IX, X: normal palatal elevation, no uvular deviation   XI:  5/5 shoulder shrug bilaterally   XII: midline tongue protrusion   MOTOR:  5/5 bilateral grip strength 5/5 strength dorsiflexion/plantarflexion b/l     STATION: normal stance, no truncal ataxia   GAIT: Normal   Psychiatric:        Mood and Affect: Mood normal.        Speech: Speech  normal.        Behavior: Behavior normal. Behavior is  cooperative.        Thought Content: Thought content normal.        Judgment: Judgment normal.      UC Treatments / Results  Labs (all labs ordered are listed, but only abnormal results are displayed) Labs Reviewed  CBC WITH DIFFERENTIAL/PLATELET  COMPREHENSIVE METABOLIC PANEL WITH GFR  VITAMIN B12    EKG   Radiology No results found.  Procedures Procedures (including critical care time)  Medications Ordered in UC Medications - No data to display  Initial Impression / Assessment and Plan / UC Course  I have reviewed the triage vital signs and the nursing notes.  Pertinent labs & imaging results that were available during my care of the patient were reviewed by me and considered in my medical decision making (see chart for details).      Final Clinical Impressions(s) / UC Diagnoses   Final diagnoses:  Acute non intractable tension-type headache   Patient presents today with concerns for headache predominantly on the left parietal area but sometimes with horseshoe type presentation along the occipital area and temporal lobes.  He reports this has been ongoing for several days.  Of note patient reports decreased sleep and some increased stress at work.  Physical exam and neurological findings are overall reassuring at this time.  Suspect tension type headache today.  Reviewed that this is typically muscular in nature and needs to be treated as such with over-the-counter NSAIDs, warm compresses, gentle stretches and massage as tolerated.  Will check CBC, CMP, B12 for rule out of anemia, dehydration, electrolyte abnormality.  ED and return precautions reviewed and provided after visit summary.  Follow-up as needed.    Discharge Instructions      You are seen today with concerns of persistent headache.  At this time I suspect this is likely secondary to lack of sleep and potential muscle stress and strain.  These  headaches are typically called a tension type headache and usually resolve with home measures and over-the-counter medications. I recommend trying NSAIDs such as ibuprofen, Aleve, Advil per your preference. You can also try warm compresses to the area, gentle massage, stretches and stress reduction to help with relieving your symptoms. We are collecting lab work to check for dehydration, anemia and low B12.  We will keep you updated on those results once they are available. As needed to help with your motion sickness you can try taking over-the-counter Dramamine.  Please note that some formulations of this can make you sleepy or drowsy so please do not take it if you need to drive or remain alert. If you feel like your symptoms are not improving or seem to be worsening I recommend following up with your primary care provider or return to urgent care for further evaluation.   ED Prescriptions   None    PDMP not reviewed this encounter.   Jerona Mooring, PA-C 12/30/23 1217

## 2023-12-30 NOTE — ED Triage Notes (Addendum)
 Pt presents with complaints of headaches and dizziness x 2 days. Pt states he had about three hours of sleep for two nights (Wednesday and Thursday) before symptoms began. OTC Ibuprofen extra-strength taken with no noticeable improvement. Still lingering headache today. Pt currently rates his overall pain a 3/10. Took an at-home COVID test last night, it was negative.

## 2023-12-31 LAB — COMPREHENSIVE METABOLIC PANEL WITH GFR
ALT: 25 IU/L (ref 0–44)
AST: 31 IU/L (ref 0–40)
Albumin: 4.5 g/dL (ref 4.1–5.1)
Alkaline Phosphatase: 58 IU/L (ref 44–121)
BUN/Creatinine Ratio: 16 (ref 9–20)
BUN: 14 mg/dL (ref 6–24)
Bilirubin Total: 0.4 mg/dL (ref 0.0–1.2)
CO2: 19 mmol/L — ABNORMAL LOW (ref 20–29)
Calcium: 9.8 mg/dL (ref 8.7–10.2)
Chloride: 107 mmol/L — ABNORMAL HIGH (ref 96–106)
Creatinine, Ser: 0.86 mg/dL (ref 0.76–1.27)
Globulin, Total: 2.6 g/dL (ref 1.5–4.5)
Glucose: 88 mg/dL (ref 70–99)
Potassium: 4.2 mmol/L (ref 3.5–5.2)
Sodium: 141 mmol/L (ref 134–144)
Total Protein: 7.1 g/dL (ref 6.0–8.5)
eGFR: 111 mL/min/{1.73_m2} (ref >59)

## 2023-12-31 LAB — CBC WITH DIFFERENTIAL/PLATELET
Basophils Absolute: 0.1 10*3/uL (ref 0.0–0.2)
Basos: 1 %
EOS (ABSOLUTE): 0.1 10*3/uL (ref 0.0–0.4)
Eos: 1 %
Hematocrit: 46 % (ref 37.5–51.0)
Hemoglobin: 14.9 g/dL (ref 13.0–17.7)
Immature Grans (Abs): 0 10*3/uL (ref 0.0–0.1)
Immature Granulocytes: 0 %
Lymphocytes Absolute: 2.7 10*3/uL (ref 0.7–3.1)
Lymphs: 55 %
MCH: 29.9 pg (ref 26.6–33.0)
MCHC: 32.4 g/dL (ref 31.5–35.7)
MCV: 92 fL (ref 79–97)
Monocytes Absolute: 0.3 10*3/uL (ref 0.1–0.9)
Monocytes: 7 %
Neutrophils Absolute: 1.7 10*3/uL (ref 1.4–7.0)
Neutrophils: 36 %
Platelets: 196 10*3/uL (ref 150–450)
RBC: 4.98 x10E6/uL (ref 4.14–5.80)
RDW: 13.9 % (ref 11.6–15.4)
WBC: 4.9 10*3/uL (ref 3.4–10.8)

## 2023-12-31 LAB — VITAMIN B12: Vitamin B-12: 285 pg/mL (ref 232–1245)

## 2024-01-01 ENCOUNTER — Ambulatory Visit (HOSPITAL_COMMUNITY): Payer: Self-pay

## 2024-01-29 ENCOUNTER — Ambulatory Visit: Admitting: Family

## 2024-01-29 ENCOUNTER — Encounter: Payer: Self-pay | Admitting: Family

## 2024-01-29 VITALS — BP 126/85 | HR 81 | Temp 98.9°F | Resp 16 | Ht 73.0 in | Wt 259.6 lb

## 2024-01-29 DIAGNOSIS — G44209 Tension-type headache, unspecified, not intractable: Secondary | ICD-10-CM

## 2024-01-29 NOTE — Progress Notes (Signed)
 Patient ID: Justin Serrano, male    DOB: [birth year]  MRN: [id number]  CC: Urgent Care Follow-Up  Subjective: Brolin Dambrosia is a 43 y.o. male who presents for Urgent Care follow-up.   His concerns today include:  Patient recently seen on 12/30/2023 at [facility] at [place] ([place]) for headaches. States since then headaches have improved some. Denies red flag symptoms. He is taking over-the-counter medications to help. States he would like referral to specialist for further evaluation.  There are no active problems to display for this patient.    No current outpatient medications on file prior to visit.   No current facility-administered medications on file prior to visit.    Allergies  Allergen Reactions   Ampicillin Other (See Comments)    Childhood allergy    Social History   Socioeconomic History   Marital status: Married    Spouse name: Not on file   Number of children: Not on file   Years of education: Not on file   Highest education level: Master's degree (e.g., MA, MS, MEng, MEd, MSW, MBA)  Occupational History   Not on file  Tobacco Use   Smoking status: Never   Smokeless tobacco: Never  Vaping Use   Vaping status: Never Used  Substance and Sexual Activity   Alcohol use: Yes    Alcohol/week: 2.0 standard drinks of alcohol    Types: 2 Cans of beer per week    Comment: occasionally   Drug use: Never   Sexual activity: Yes    Birth control/protection: Condom  Other Topics Concern   Not on file  Social History Narrative   Not on file   Social Drivers of Health   Financial Resource Strain: Low Risk  (07/23/2023)   Overall Financial Resource Strain (CARDIA)    Difficulty of Paying Living Expenses: Not hard at all  Food Insecurity: No Food Insecurity (07/23/2023)   Hunger Vital Sign    Worried About Running Out of Food in the Last Year: Never true    Ran Out of Food in the Last Year: Never true  Transportation Needs: No  Transportation Needs (07/23/2023)   PRAPARE - Administrator, Civil Service (Medical): No    Lack of Transportation (Non-Medical): No  Physical Activity: Insufficiently Active (07/23/2023)   Exercise Vital Sign    Days of Exercise per Week: 3 days    Minutes of Exercise per Session: 40 min  Stress: No Stress Concern Present (07/23/2023)   [employer] of Occupational Health - Occupational Stress Questionnaire    Feeling of Stress : Only a little  Social Connections: Socially Isolated (07/23/2023)   Social Connection and Isolation Panel    Frequency of Communication with Friends and Family: Once a week    Frequency of Social Gatherings with Friends and Family: Once a week    Attends Religious Services: Never    Database administrator or Organizations: No    Attends Engineer, structural: Not on file    Marital Status: Married  Catering manager Violence: Not At Risk (05/09/2023)   Humiliation, Afraid, Rape, and Kick questionnaire    Fear of Current or Ex-Partner: No    Emotionally Abused: No    Physically Abused: No    Sexually Abused: No    Family History  Problem Relation Age of Onset   Healthy Mother    Stroke Mother    Healthy Father    Healthy Sister  Cancer Maternal Grandfather    Heart disease Neg Hx    Heart attack Neg Hx     Past Surgical History:  Procedure Laterality Date   TONSILLECTOMY     TONSILLECTOMY AND ADENOIDECTOMY      ROS: Review of Systems Negative except as stated above  PHYSICAL EXAM: BP 126/85   Pulse 81   Temp 98.9 F (37.2 C) (Oral)   Resp 16   Ht 6' 1 (1.854 m)   Wt 259 lb 9.6 oz (117.8 kg)   SpO2 98%   BMI 34.25 kg/m   Physical Exam HENT:     Head: Normocephalic and atraumatic.     Right Ear: Tympanic membrane, ear canal and external ear normal.     Left Ear: Tympanic membrane, ear canal and external ear normal.     Nose: Nose normal.     Mouth/Throat:     Mouth: Mucous membranes are moist.      Pharynx: Oropharynx is clear.   Eyes:     Extraocular Movements: Extraocular movements intact.     Conjunctiva/sclera: Conjunctivae normal.     Pupils: Pupils are equal, round, and reactive to light.    Cardiovascular:     Rate and Rhythm: Normal rate and regular rhythm.     Pulses: Normal pulses.     Heart sounds: Normal heart sounds.  Pulmonary:     Effort: Pulmonary effort is normal.     Breath sounds: Normal breath sounds.   Musculoskeletal:        General: Normal range of motion.     Cervical back: Normal range of motion and neck supple.   Neurological:     General: No focal deficit present.     Mental Status: He is alert and oriented to person, place, and time.   Psychiatric:        Mood and Affect: Mood normal.        Behavior: Behavior normal.     ASSESSMENT AND PLAN: 1. Acute non intractable tension-type headache (Primary) - Continue over-the-counter pharmacological therapy. - Referral to Neurology for evaluation/management. - Ambulatory referral to Neurology   Patient was given the opportunity to ask questions.  Patient verbalized understanding of the plan and was able to repeat key elements of the plan. Patient was given clear instructions to go to Emergency Department or return to medical center if symptoms don't improve, worsen, or new problems develop.The patient verbalized understanding.   Orders Placed This Encounter  Procedures   Ambulatory referral to Neurology   Return for Follow-Up or next available with Raguel Blush, MD.  Greig JINNY Drones, NP

## 2024-01-29 NOTE — Progress Notes (Signed)
 Recurring headaches, went to urgent care about 3 weeks ago.  Patient has never had headaches and was a referral for a MRI

## 2024-02-08 ENCOUNTER — Ambulatory Visit: Admitting: Family

## 2024-04-14 ENCOUNTER — Telehealth: Payer: Self-pay | Admitting: Diagnostic Neuroimaging

## 2024-04-14 NOTE — Telephone Encounter (Signed)
 LVM and sent mychart msg informing pt of need to reschedule 04/15/24 appt - MD out

## 2024-04-15 ENCOUNTER — Institutional Professional Consult (permissible substitution): Admitting: Diagnostic Neuroimaging

## 2024-04-15 NOTE — Telephone Encounter (Signed)
 Patient rescheduled appointment and ask to be put on waitlist.

## 2024-06-03 ENCOUNTER — Institutional Professional Consult (permissible substitution): Admitting: Diagnostic Neuroimaging

## 2024-08-12 ENCOUNTER — Telehealth: Payer: Self-pay | Admitting: Diagnostic Neuroimaging

## 2024-08-12 NOTE — Telephone Encounter (Signed)
 LVM and sent mychart informing pt 3/30 appt needs to be resched MD out

## 2024-10-27 ENCOUNTER — Ambulatory Visit: Admitting: Diagnostic Neuroimaging
